# Patient Record
Sex: Female | Born: 1955 | Race: Black or African American | Hispanic: No | Marital: Married | Smoking: Former smoker
Health system: Southern US, Community
[De-identification: ages and names within clinical notes are randomized; demographics above are authoritative.]

## PROBLEM LIST (undated history)

## (undated) DIAGNOSIS — M1990 Unspecified osteoarthritis, unspecified site: Secondary | ICD-10-CM

## (undated) DIAGNOSIS — K5790 Diverticulosis of intestine, part unspecified, without perforation or abscess without bleeding: Secondary | ICD-10-CM

## (undated) DIAGNOSIS — I10 Essential (primary) hypertension: Secondary | ICD-10-CM

## (undated) DIAGNOSIS — K5792 Diverticulitis of intestine, part unspecified, without perforation or abscess without bleeding: Secondary | ICD-10-CM

## (undated) DIAGNOSIS — F419 Anxiety disorder, unspecified: Secondary | ICD-10-CM

## (undated) DIAGNOSIS — F0390 Unspecified dementia without behavioral disturbance: Secondary | ICD-10-CM

## (undated) DIAGNOSIS — E7800 Pure hypercholesterolemia, unspecified: Secondary | ICD-10-CM

## (undated) HISTORY — PX: WRIST ARTHROSCOPY: SUR100

## (undated) HISTORY — PX: FOOT ARTHROPLASTY: SHX1657

## (undated) HISTORY — DX: Diverticulitis of intestine, part unspecified, without perforation or abscess without bleeding: K57.92

## (undated) HISTORY — PX: EYE SURGERY: SHX253

## (undated) HISTORY — PX: BUNIONECTOMY: SHX129

## (undated) HISTORY — PX: ABDOMINAL HYSTERECTOMY: SHX81

## (undated) HISTORY — DX: Diverticulosis of intestine, part unspecified, without perforation or abscess without bleeding: K57.90

## (undated) HISTORY — DX: Anxiety disorder, unspecified: F41.9

---

## 2011-04-20 ENCOUNTER — Emergency Department (HOSPITAL_COMMUNITY)
Admission: EM | Admit: 2011-04-20 | Discharge: 2011-04-20 | Disposition: A | Discharge status: Home / Self Care | Payer: BC Managed Care – PPO | Attending: Emergency Medicine | Admitting: Emergency Medicine

## 2011-04-20 ENCOUNTER — Encounter: Payer: Self-pay | Admitting: Emergency Medicine

## 2011-04-20 DIAGNOSIS — S61209A Unspecified open wound of unspecified finger without damage to nail, initial encounter: Secondary | ICD-10-CM | POA: Insufficient documentation

## 2011-04-20 DIAGNOSIS — S61219A Laceration without foreign body of unspecified finger without damage to nail, initial encounter: Secondary | ICD-10-CM

## 2011-04-20 DIAGNOSIS — W260XXA Contact with knife, initial encounter: Secondary | ICD-10-CM | POA: Insufficient documentation

## 2011-04-20 DIAGNOSIS — E119 Type 2 diabetes mellitus without complications: Secondary | ICD-10-CM | POA: Insufficient documentation

## 2011-04-20 DIAGNOSIS — I10 Essential (primary) hypertension: Secondary | ICD-10-CM | POA: Insufficient documentation

## 2011-04-20 HISTORY — DX: Essential (primary) hypertension: I10

## 2011-04-20 HISTORY — DX: Pure hypercholesterolemia, unspecified: E78.00

## 2011-04-20 MED ORDER — HYDROCODONE-ACETAMINOPHEN 5-325 MG PO TABS
2.0000 | ORAL_TABLET | Freq: Once | ORAL | Status: AC
  Administered 2011-04-20: 2 via ORAL
  Filled 2011-04-20: qty 2

## 2011-04-20 MED ORDER — HYDROCODONE-ACETAMINOPHEN 5-325 MG PO TABS: 1.0000 | ORAL_TABLET | ORAL | Status: AC | PRN

## 2011-04-20 MED ORDER — ONDANSETRON HCL 4 MG PO TABS
4.0000 mg | ORAL_TABLET | Freq: Once | ORAL | Status: AC
  Administered 2011-04-20: 4 mg via ORAL
  Filled 2011-04-20: qty 1

## 2011-04-20 MED ORDER — LIDOCAINE HCL (PF) 1 % IJ SOLN
5.0000 mL | Freq: Once | INTRAMUSCULAR | Status: AC
  Administered 2011-04-20: 5 mL via INTRADERMAL
  Filled 2011-04-20: qty 5

## 2011-04-20 NOTE — ED Provider Notes (Addendum)
History     Chief Complaint  Patient presents with  . Extremity Laceration   Patient is a 55 y.o. female presenting with skin laceration. The history is provided by the patient.  Laceration  The incident occurred 3 to 5 hours ago. The laceration is located on the right hand. The laceration is 2 cm in size. The laceration mechanism was a a clean knife. The pain is moderate. The pain has been constant since onset. She reports no foreign bodies present. Her tetanus status is UTD.    Past Medical History  Diagnosis Date  . Hypertension   . Diabetes mellitus   . High cholesterol     Past Surgical History  Procedure Date  . Abdominal hysterectomy   . Foot arthroplasty     Family History  Problem Relation Age of Onset  . Heart failure Mother   . Diabetes Father     History  Substance Use Topics  . Smoking status: Never Smoker   . Smokeless tobacco: Not on file  . Alcohol Use: Yes     2 drinks/week    OB History    Grav Para Term Preterm Abortions TAB SAB Ect Mult Living                  Review of Systems  Constitutional: Negative for activity change.       All ROS Neg except as noted in HPI  HENT: Negative for nosebleeds and neck pain.   Eyes: Negative for photophobia and discharge.  Respiratory: Negative for cough, shortness of breath and wheezing.   Cardiovascular: Negative for chest pain and palpitations.  Gastrointestinal: Negative for abdominal pain and blood in stool.  Genitourinary: Negative for dysuria, frequency and hematuria.  Musculoskeletal: Negative for back pain and arthralgias.  Skin: Negative.   Neurological: Negative for dizziness, seizures and speech difficulty.  Psychiatric/Behavioral: Negative for hallucinations and confusion.    Physical Exam  BP 116/64  Pulse 100  Temp(Src) 98.7 F (37.1 C) (Oral)  Resp 18  Wt 190 lb (86.183 kg)  SpO2 97%  Physical Exam  Nursing note and vitals reviewed. Constitutional: She is oriented to person,  place, and time. She appears well-developed and well-nourished.  Non-toxic appearance.  HENT:  Head: Normocephalic.  Right Ear: Tympanic membrane and external ear normal.  Left Ear: Tympanic membrane and external ear normal.  Eyes: EOM and lids are normal. Pupils are equal, round, and reactive to light.  Neck: Normal range of motion. Neck supple. Carotid bruit is not present.  Cardiovascular: Normal rate, regular rhythm, normal heart sounds, intact distal pulses and normal pulses.   Pulmonary/Chest: Breath sounds normal. No respiratory distress.  Abdominal: Soft. Bowel sounds are normal. There is no tenderness. There is no guarding.  Musculoskeletal:       Laceration to the right index finger. No tendon or bone involvement. Sensory and cap refill wnl.  Lymphadenopathy:       Head (right side): No submandibular adenopathy present.       Head (left side): No submandibular adenopathy present.    She has no cervical adenopathy.  Neurological: She is alert and oriented to person, place, and time. She has normal strength. No cranial nerve deficit or sensory deficit.  Skin: Skin is warm and dry.  Psychiatric: She has a normal mood and affect. Her speech is normal.    ED Course  LACERATION REPAIR Performed by: Kathie Dike Authorized by: Kathie Dike Consent: Verbal consent obtained. Risks and  benefits: risks, benefits and alternatives were discussed Consent given by: patient Patient understanding: patient states understanding of the procedure being performed Patient identity confirmed: verbally with patient Time out: Immediately prior to procedure a "time out" was called to verify the correct patient, procedure, equipment, support staff and site/side marked as required. Body area: upper extremity Location details: right index finger Laceration length: 2.4 cm Foreign bodies: no foreign bodies Tendon involvement: none Nerve involvement: none Vascular damage: no Anesthesia: local  infiltration Local anesthetic: lidocaine 1% without epinephrine Patient sedated: no Preparation: Patient was prepped and draped in the usual sterile fashion. Irrigation solution: saline Amount of cleaning: standard Debridement: none Skin closure: 4-0 nylon Number of sutures: 5 Technique: simple Approximation: close Approximation difficulty: simple Dressing: gauze roll Patient tolerance: Patient tolerated the procedure well with no immediate complications.    MDM I have reviewed nursing notes, vital signs, and all appropriate lab and imaging results for this patient.      Kathie Dike, PA 04/22/11 1926  Kathie Dike, PA 10/27/11 1130  Kathie Dike, [place] 11/09/11 2207  Kathie Dike, [place] 11/21/11 [phone]

## 2011-04-20 NOTE — ED Notes (Signed)
Pt states she cut R index finger on knife wihile reaching into drawer. Bleeding controlled. PT states tetanu up to date

## 2011-07-02 NOTE — ED Provider Notes (Signed)
Medical screening examination/treatment/procedure(s) were performed by non-physician practitioner and as supervising physician I was immediately available for consultation/collaboration.   Shelda Jakes, MD 07/02/11 [phone]

## 2014-02-03 ENCOUNTER — Encounter (HOSPITAL_COMMUNITY): Payer: Self-pay | Admitting: Pharmacy Technician

## 2014-02-04 NOTE — Patient Instructions (Addendum)
20     Your procedure is scheduled on:  Tuesday 02/18/2014  Report to [facility] at  0730 AM.  Call this number if you have problems the night before or morning of surgery:  [phone]   Remember:             IF YOU USE CPAP,BRING MASK AND TUBING AM OF SURGERY!             IF YOU DO NOT HAVE YOUR TYPE AND SCREEN DRAWN AT PRE-ADMIT APPOINTMENT, YOU WILL HAVE IT DRAWN AM OF SURGERY!   Do not eat food or drink liquids AFTER MIDNIGHT!  Take these medicines the morning of surgery with A SIP OF WATER: Prednisone, Singulair, Atrovent nasal spray, Ultram if needed     IS NOT RESPONSIBLE FOR ANY BELONGINGS OR VALUABLES BROUGHT TO HOSPITAL.  Marland Kitchen  Leave suitcase in the car. After surgery it may be brought to your room.  For patients admitted to the hospital, checkout time is 11:00 AM the day of              Discharge.    DO NOT WEAR  JEWELRY,MAKE-UP,LOTIONS,POWDERS,PERFUMES,CONTACTS , DENTURES OR BRIDGEWORK ,AND DO NOT WEAR FALSE EYELASHES                                    Patients discharged the day of surgery will not be allowed to drive home. If going home the same day of surgery, must have someone stay with you first 24 hrs.at home and arrange for someone to drive you home from the Hospital.                        YOUR DRIVER IS:N/A   Special Instructions:              Please read over the following fact sheets that you were given:             1.  PREPARING FOR SURGERY SHEET              2.[facility]                                        Brandi Fitzgerald     [phone]                              [facility] - Preparing for Surgery Before surgery, you can play an important role.  Because skin is not sterile, your skin needs to be as free of germs as possible.  You can reduce the number of germs on your skin by washing with CHG (chlorahexidine gluconate) soap before surgery.  CHG is an antiseptic cleaner which kills germs and  bonds with the skin to continue killing germs even after washing. Please DO NOT use if you have an allergy to CHG or antibacterial soaps.  If your skin becomes reddened/irritated stop using the CHG and inform your nurse when you arrive at Short Stay. Do not shave (including legs and underarms) for at least 48 hours prior to the first CHG shower.  You may shave your face. Please follow these instructions carefully:  1.  Shower with CHG Soap the night before surgery and  the  morning of Surgery.  2.  If you choose to wash your hair, wash your hair first as usual with your  normal  shampoo.  3.  After you shampoo, rinse your hair and body thoroughly to remove the  shampoo.                           4.  Use CHG as you would any other liquid soap.  You can apply chg directly  to the skin and wash                       Gently with a scrungie or clean washcloth.  5.  Apply the CHG Soap to your body ONLY FROM THE NECK DOWN.   Do not use on open                           Wound or open sores. Avoid contact with eyes, ears mouth and genitals (private parts).                        Genitals (private parts) with your normal soap.             6.  Wash thoroughly, paying special attention to the area where your surgery  will be performed.  7.  Thoroughly rinse your body with warm water from the neck down.  8.  DO NOT shower/wash with your normal soap after using and rinsing off  the CHG Soap.                9.  Pat yourself dry with a clean towel.            10.  Wear clean pajamas.            11.  Place clean sheets on your bed the night of your first shower and do not  sleep with pets. Day of Surgery : Do not apply any lotions/deodorants the morning of surgery.  Please wear clean clothes to the hospital/surgery center.  FAILURE TO FOLLOW THESE INSTRUCTIONS MAY RESULT IN THE CANCELLATION OF YOUR SURGERY PATIENT SIGNATURE_________________________________  NURSE  SIGNATURE__________________________________  ________________________________________________________________________   Brandi Fitzgerald  An incentive spirometer is a tool that can help keep your lungs clear and active. This tool measures how well you are filling your lungs with each breath. Taking long deep breaths may help reverse or decrease the chance of developing breathing (pulmonary) problems (especially infection) following:  A long period of time when you are unable to move or be active. BEFORE THE PROCEDURE   If the spirometer includes an indicator to show your best effort, your nurse or respiratory therapist will set it to a desired goal.  If possible, sit up straight or lean slightly forward. Try not to slouch.  Hold the incentive spirometer in an upright position. INSTRUCTIONS FOR USE  1. Sit on the edge of your bed if possible, or sit up as far as you can in bed or on a chair. 2. Hold the incentive spirometer in an upright position. 3. Breathe out normally. 4. Place the mouthpiece in your mouth and seal your lips tightly around it. 5. Breathe in slowly and as deeply as possible, raising the piston or the ball toward the top of the column. 6. Hold your breath for 3-5 seconds or for as long as possible. Allow  the piston or ball to fall to the bottom of the column. 7. Remove the mouthpiece from your mouth and breathe out normally. 8. Rest for a few seconds and repeat Steps 1 through 7 at least 10 times every 1-2 hours when you are awake. Take your time and take a few normal breaths between deep breaths. 9. The spirometer may include an indicator to show your best effort. Use the indicator as a goal to work toward during each repetition. 10. After each set of 10 deep breaths, practice coughing to be sure your lungs are clear. If you have an incision (the cut made at the time of surgery), support your incision when coughing by placing a pillow or rolled up towels firmly  against it. Once you are able to get out of bed, walk around indoors and cough well. You may stop using the incentive spirometer when instructed by your caregiver.  RISKS AND COMPLICATIONS  Take your time so you do not get dizzy or light-headed.  If you are in pain, you may need to take or ask for pain medication before doing incentive spirometry. It is harder to take a deep breath if you are having pain. AFTER USE  Rest and breathe slowly and easily.  It can be helpful to keep track of a log of your progress. Your caregiver can provide you with a simple table to help with this. If you are using the spirometer at home, follow these instructions: SEEK MEDICAL CARE IF:   You are having difficultly using the spirometer.  You have trouble using the spirometer as often as instructed.  Your pain medication is not giving enough relief while using the spirometer.  You develop fever of 100.5 F (38.1 C) or higher. SEEK IMMEDIATE MEDICAL CARE IF:   You cough up bloody sputum that had not been present before.  You develop fever of 102 F (38.9 C) or greater.  You develop worsening pain at or near the incision site. MAKE SURE YOU:   Understand these instructions.  Will watch your condition.  Will get help right away if you are not doing well or get worse. Document Released: 02/06/2007 Document Revised: 12/19/2011 Document Reviewed: 04/09/2007 [employer] Patient Information 2014 Exit[employer], [place].   ________________________________________________________________________  WHAT IS A BLOOD TRANSFUSION? Blood Transfusion Information  A transfusion is the replacement of blood or some of its parts. Blood is made up of multiple cells which provide different functions.  Red blood cells carry oxygen and are used for blood loss replacement.  White blood cells fight against infection.  Platelets control bleeding.  Plasma helps clot blood.  Other blood products are available for  specialized needs, such as hemophilia or other clotting disorders. BEFORE THE TRANSFUSION  Who gives blood for transfusions?   Healthy volunteers who are fully evaluated to make sure their blood is safe. This is blood bank blood. Transfusion therapy is the safest it has ever been in the practice of medicine. Before blood is taken from a donor, a complete history is taken to make sure that person has no history of diseases nor engages in risky social behavior (examples are intravenous drug use or sexual activity with multiple partners). The donor's travel history is screened to minimize risk of transmitting infections, such as malaria. The donated blood is tested for signs of infectious diseases, such as HIV and hepatitis. The blood is then tested to be sure it is compatible with you in order to minimize the chance of a transfusion reaction.  If you or a relative donates blood, this is often done in anticipation of surgery and is not appropriate for emergency situations. It takes many days to process the donated blood. RISKS AND COMPLICATIONS Although transfusion therapy is very safe and saves many lives, the main dangers of transfusion include:   Getting an infectious disease.  Developing a transfusion reaction. This is an allergic reaction to something in the blood you were given. Every precaution is taken to prevent this. The decision to have a blood transfusion has been considered carefully by your caregiver before blood is given. Blood is not given unless the benefits outweigh the risks. AFTER THE TRANSFUSION  Right after receiving a blood transfusion, you will usually feel much better and more energetic. This is especially true if your red blood cells have gotten low (anemic). The transfusion raises the level of the red blood cells which carry oxygen, and this usually causes an energy increase.  The nurse administering the transfusion will monitor you carefully for complications. HOME CARE  INSTRUCTIONS  No special instructions are needed after a transfusion. You may find your energy is better. Speak with your caregiver about any limitations on activity for underlying diseases you may have. SEEK MEDICAL CARE IF:   Your condition is not improving after your transfusion.  You develop redness or irritation at the intravenous (IV) site. SEEK IMMEDIATE MEDICAL CARE IF:  Any of the following symptoms occur over the next 12 hours:  Shaking chills.  You have a temperature by mouth above 102 F (38.9 C), not controlled by medicine.  Chest, back, or muscle pain.  People around you feel you are not acting correctly or are confused.  Shortness of breath or difficulty breathing.  Dizziness and fainting.  You get a rash or develop hives.  You have a decrease in urine output.  Your urine turns a dark color or changes to pink, red, or brown. Any of the following symptoms occur over the next 10 days:  You have a temperature by mouth above 102 F (38.9 C), not controlled by medicine.  Shortness of breath.  Weakness after normal activity.  The white part of the eye turns yellow (jaundice).  You have a decrease in the amount of urine or are urinating less often.  Your urine turns a dark color or changes to pink, red, or brown. Document Released: 09/23/2000 Document Revised: 12/19/2011 Document Reviewed: 05/12/2008 [facility] Patient Information 2014 [place], [place].  _______________________________________________________________________

## 2014-02-05 ENCOUNTER — Encounter (HOSPITAL_COMMUNITY)
Admission: RE | Admit: 2014-02-05 | Discharge: 2014-02-05 | Disposition: A | Discharge status: Home / Self Care | Payer: BC Managed Care – PPO | Source: Ambulatory Visit | Attending: Orthopedic Surgery | Admitting: Orthopedic Surgery

## 2014-02-05 ENCOUNTER — Encounter (HOSPITAL_COMMUNITY): Payer: Self-pay

## 2014-02-05 ENCOUNTER — Other Ambulatory Visit: Payer: Self-pay | Admitting: Physician Assistant

## 2014-02-05 ENCOUNTER — Ambulatory Visit (HOSPITAL_COMMUNITY)
Admission: RE | Admit: 2014-02-05 | Discharge: 2014-02-05 | Disposition: A | Discharge status: Home / Self Care | Payer: BC Managed Care – PPO | Source: Ambulatory Visit | Attending: Anesthesiology | Admitting: Anesthesiology

## 2014-02-05 DIAGNOSIS — Z01818 Encounter for other preprocedural examination: Secondary | ICD-10-CM | POA: Insufficient documentation

## 2014-02-05 HISTORY — DX: Unspecified osteoarthritis, unspecified site: M19.90

## 2014-02-05 LAB — URINALYSIS, ROUTINE W REFLEX MICROSCOPIC
BILIRUBIN URINE: NEGATIVE
Glucose, UA: NEGATIVE mg/dL
Hgb urine dipstick: NEGATIVE
Ketones, ur: NEGATIVE mg/dL
LEUKOCYTES UA: NEGATIVE
NITRITE: NEGATIVE
PROTEIN: NEGATIVE mg/dL
Specific Gravity, Urine: 1.02 (ref 1.005–1.030)
UROBILINOGEN UA: 0.2 mg/dL (ref 0.0–1.0)
pH: 5.5 (ref 5.0–8.0)

## 2014-02-05 LAB — CBC
HCT: 35.9 % — ABNORMAL LOW (ref 36.0–46.0)
Hemoglobin: 11.6 g/dL — ABNORMAL LOW (ref 12.0–15.0)
MCH: 29.2 pg (ref 26.0–34.0)
MCHC: 32.3 g/dL (ref 30.0–36.0)
MCV: 90.4 fL (ref 78.0–100.0)
Platelets: 477 10*3/uL — ABNORMAL HIGH (ref 150–400)
RBC: 3.97 MIL/uL (ref 3.87–5.11)
RDW: 14.8 % (ref 11.5–15.5)
WBC: 14.2 10*3/uL — AB (ref 4.0–10.5)

## 2014-02-05 LAB — APTT: aPTT: 32 seconds (ref 24–37)

## 2014-02-05 LAB — BASIC METABOLIC PANEL
BUN: 14 mg/dL (ref 6–23)
CO2: 27 mEq/L (ref 19–32)
CREATININE: 0.96 mg/dL (ref 0.50–1.10)
Calcium: 10 mg/dL (ref 8.4–10.5)
Chloride: 99 mEq/L (ref 96–112)
GFR calc non Af Amer: 63 mL/min — ABNORMAL LOW (ref >90)
GFR, EST AFRICAN AMERICAN: 74 mL/min — AB (ref >90)
Glucose, Bld: 117 mg/dL — ABNORMAL HIGH (ref 70–99)
POTASSIUM: 4.1 meq/L (ref 3.7–5.3)
Sodium: 139 mEq/L (ref 137–147)

## 2014-02-05 LAB — PROTIME-INR
INR: 1.07 (ref 0.00–1.49)
PROTHROMBIN TIME: 13.7 s (ref 11.6–15.2)

## 2014-02-05 LAB — SURGICAL PCR SCREEN
MRSA, PCR: NEGATIVE
STAPHYLOCOCCUS AUREUS: NEGATIVE

## 2014-02-05 NOTE — Progress Notes (Signed)
01/03/2014-Last office note and EKG from Dr. Kathryne Sharper from [facility], [place]. On chart.

## 2014-02-05 NOTE — H&P (Signed)
Brandi Fitzgerald is an 58 y.o. female.    Chief Complaint: left knee pain  HPI: Pt is a 58 y.o. female complaining of left knee pain for multiple years. Pain had continually increased since the beginning. X-rays in the clinic show end-stage arthritic changes of the left knee. Pt has tried various conservative treatments which have failed to alleviate their symptoms, including injections and therapy. Various options are discussed with the patient. Risks, benefits and expectations were discussed with the patient. Patient understand the risks, benefits and expectations and wishes to proceed with surgery.   PCP:  NIDA,GEBRESELASSIE, MD  D/C Plans:  SNF/Rehab  PMH: Past Medical History  Diagnosis Date  . Hypertension   . Diabetes mellitus   . High cholesterol     PSH: Past Surgical History  Procedure Laterality Date  . Abdominal hysterectomy    . Foot arthroplasty      Social History:  reports that she has never smoked. She does not have any smokeless tobacco history on file. She reports that she drinks alcohol. She reports that she does not use illicit drugs.  Allergies:  Allergies  Allergen Reactions  . Januvia [Sitagliptin Phosphate] Anaphylaxis  . Aspirin     Cant remember  . Cephalosporins Swelling  . Penicillins Itching and Swelling    Medications: Current Outpatient Prescriptions  Medication Sig Dispense Refill  . acidophilus (RISAQUAD) CAPS capsule Take 1 capsule by mouth daily.      Marland Kitchen esomeprazole (NEXIUM) 40 MG capsule Take 40 mg by mouth daily at 12 noon.      . Estradiol (VAGIFEM) 10 MCG TABS vaginal tablet Place 1 tablet vaginally 2 (two) times a week.      . etanercept (ENBREL) 25 MG injection Inject 50 mg into the skin once a week.      Marland Kitchen HYDROcodone-acetaminophen (NORCO/VICODIN) 5-325 MG per tablet Take 1 tablet by mouth every 6 (six) hours as needed for moderate pain.      Marland Kitchen ipratropium (ATROVENT) 0.03 % nasal spray Place 2 sprays into both nostrils daily.       Marland Kitchen lisinopril (PRINIVIL,ZESTRIL) 20 MG tablet Take 20 mg by mouth every morning.      . metFORMIN (GLUCOPHAGE) 500 MG tablet Take 500 mg by mouth daily with breakfast.      . montelukast (SINGULAIR) 10 MG tablet Take 10 mg by mouth daily.      . predniSONE (DELTASONE) 5 MG tablet Take 5 mg by mouth daily with breakfast.      . rosuvastatin (CRESTOR) 20 MG tablet Take 20 mg by mouth every morning.      . traMADol (ULTRAM) 50 MG tablet Take 50 mg by mouth every 6 (six) hours as needed for moderate pain.      . valACYclovir (VALTREX) 500 MG tablet Take 500 mg by mouth daily.       No current facility-administered medications for this visit.    No results found for this or any previous visit (from the past 48 hour(s)). No results found.  ROS: Pain with rom of the left lower extremity  Physical Exam: BP:   190/110  ;  HR:   84  ; Resp:   14  : Alert and oriented 58 y.o. female in no acute distress Cranial nerves 2-12 intact Cervical spine: full rom with no tenderness, nv intact distally Chest: active breath sounds bilaterally, no wheeze rhonchi or rales Heart: regular rate and rhythm, no murmur Abd: non tender non distended with active bowel  sounds Hip is stable with rom  Pain with rom of bilateral knees nv intact distally No rashes or edema Moderately antalgic gait  Assessment/Plan Assessment: left knee end stage osteoarthritis  Plan: Patient will undergo a left total knee arthroplasty by Dr. Charlann Boxer at [facility]. Risks benefits and expectations were discussed with the patient. Patient understand risks, benefits and expectations and wishes to proceed.

## 2014-02-18 ENCOUNTER — Inpatient Hospital Stay (HOSPITAL_COMMUNITY): Payer: BC Managed Care – PPO | Admitting: Anesthesiology

## 2014-02-18 ENCOUNTER — Encounter (HOSPITAL_COMMUNITY): Payer: Self-pay | Admitting: *Deleted

## 2014-02-18 ENCOUNTER — Inpatient Hospital Stay (HOSPITAL_COMMUNITY)
Admission: RE | Admit: 2014-02-18 | Discharge: 2014-02-20 | DRG: 470 | Disposition: A | Discharge status: Home Health | Payer: BC Managed Care – PPO | Source: Ambulatory Visit | Attending: Orthopedic Surgery | Admitting: Orthopedic Surgery

## 2014-02-18 ENCOUNTER — Encounter (HOSPITAL_COMMUNITY)
Admission: RE | Disposition: A | Discharge status: Home Health | Payer: Self-pay | Source: Ambulatory Visit | Attending: Orthopedic Surgery

## 2014-02-18 ENCOUNTER — Encounter (HOSPITAL_COMMUNITY): Payer: BC Managed Care – PPO | Admitting: Anesthesiology

## 2014-02-18 DIAGNOSIS — D500 Iron deficiency anemia secondary to blood loss (chronic): Secondary | ICD-10-CM | POA: Diagnosis not present

## 2014-02-18 DIAGNOSIS — IMO0002 Reserved for concepts with insufficient information to code with codable children: Secondary | ICD-10-CM

## 2014-02-18 DIAGNOSIS — I10 Essential (primary) hypertension: Secondary | ICD-10-CM | POA: Diagnosis present

## 2014-02-18 DIAGNOSIS — M069 Rheumatoid arthritis, unspecified: Secondary | ICD-10-CM | POA: Diagnosis present

## 2014-02-18 DIAGNOSIS — E119 Type 2 diabetes mellitus without complications: Secondary | ICD-10-CM | POA: Diagnosis present

## 2014-02-18 DIAGNOSIS — M1710 Unilateral primary osteoarthritis, unspecified knee: Principal | ICD-10-CM | POA: Diagnosis present

## 2014-02-18 DIAGNOSIS — M6580 Other synovitis and tenosynovitis, unspecified site: Secondary | ICD-10-CM | POA: Diagnosis present

## 2014-02-18 DIAGNOSIS — Z888 Allergy status to other drugs, medicaments and biological substances status: Secondary | ICD-10-CM

## 2014-02-18 DIAGNOSIS — Z79899 Other long term (current) drug therapy: Secondary | ICD-10-CM

## 2014-02-18 DIAGNOSIS — E669 Obesity, unspecified: Secondary | ICD-10-CM | POA: Diagnosis present

## 2014-02-18 DIAGNOSIS — Z880 Allergy status to penicillin: Secondary | ICD-10-CM

## 2014-02-18 DIAGNOSIS — Z6833 Body mass index (BMI) 33.0-33.9, adult: Secondary | ICD-10-CM

## 2014-02-18 DIAGNOSIS — D62 Acute posthemorrhagic anemia: Secondary | ICD-10-CM | POA: Diagnosis not present

## 2014-02-18 DIAGNOSIS — Z96659 Presence of unspecified artificial knee joint: Secondary | ICD-10-CM

## 2014-02-18 DIAGNOSIS — E7800 Pure hypercholesterolemia, unspecified: Secondary | ICD-10-CM | POA: Diagnosis present

## 2014-02-18 HISTORY — PX: TOTAL KNEE ARTHROPLASTY: SHX125

## 2014-02-18 LAB — GLUCOSE, CAPILLARY
Glucose-Capillary: 93 mg/dL (ref 70–99)
Glucose-Capillary: 115 mg/dL — ABNORMAL HIGH (ref 70–99)
Glucose-Capillary: 133 mg/dL — ABNORMAL HIGH (ref 70–99)
Glucose-Capillary: 134 mg/dL — ABNORMAL HIGH (ref 70–99)

## 2014-02-18 LAB — TYPE AND SCREEN
ABO/RH(D): O POS
Antibody Screen: NEGATIVE

## 2014-02-18 LAB — ABO/RH: ABO/RH(D): O POS

## 2014-02-18 SURGERY — ARTHROPLASTY, KNEE, TOTAL
Anesthesia: Spinal | Site: Knee | Laterality: Left

## 2014-02-18 MED ORDER — RISAQUAD PO CAPS
1.0000 | ORAL_CAPSULE | Freq: Every day | ORAL | Status: DC
  Administered 2014-02-18 – 2014-02-20 (×3): 1 via ORAL
  Filled 2014-02-18 (×3): qty 1

## 2014-02-18 MED ORDER — BUPIVACAINE-EPINEPHRINE (PF) 0.25% -1:200000 IJ SOLN
INTRAMUSCULAR | Status: AC
  Filled 2014-02-18: qty 30

## 2014-02-18 MED ORDER — VALACYCLOVIR HCL 500 MG PO TABS
500.0000 mg | ORAL_TABLET | Freq: Every day | ORAL | Status: DC
  Administered 2014-02-18 – 2014-02-20 (×3): 500 mg via ORAL
  Filled 2014-02-18 (×3): qty 1

## 2014-02-18 MED ORDER — ONDANSETRON HCL 4 MG/2ML IJ SOLN
INTRAMUSCULAR | Status: AC
  Filled 2014-02-18: qty 2

## 2014-02-18 MED ORDER — PHENOL 1.4 % MT LIQD: 1.0000 | OROMUCOSAL | Status: DC | PRN

## 2014-02-18 MED ORDER — PHENYLEPHRINE HCL 10 MG/ML IJ SOLN
INTRAMUSCULAR | Status: DC | PRN
  Administered 2014-02-18 (×9): 40 ug via INTRAVENOUS

## 2014-02-18 MED ORDER — ONDANSETRON HCL 4 MG/2ML IJ SOLN
INTRAMUSCULAR | Status: DC | PRN
  Administered 2014-02-18: 4 mg via INTRAVENOUS

## 2014-02-18 MED ORDER — HYDROMORPHONE HCL PF 1 MG/ML IJ SOLN
0.5000 mg | INTRAMUSCULAR | Status: DC | PRN
  Administered 2014-02-18 (×2): 1 mg via INTRAVENOUS
  Administered 2014-02-18: 0.5 mg via INTRAVENOUS
  Administered 2014-02-18 – 2014-02-19 (×3): 1 mg via INTRAVENOUS
  Filled 2014-02-18 (×6): qty 1

## 2014-02-18 MED ORDER — LACTATED RINGERS IV SOLN
INTRAVENOUS | Status: DC
  Administered 2014-02-18: 1000 mL via INTRAVENOUS

## 2014-02-18 MED ORDER — MENTHOL 3 MG MT LOZG: 1.0000 | LOZENGE | OROMUCOSAL | Status: DC | PRN

## 2014-02-18 MED ORDER — FERROUS SULFATE 325 (65 FE) MG PO TABS
325.0000 mg | ORAL_TABLET | Freq: Three times a day (TID) | ORAL | Status: DC
  Administered 2014-02-18 – 2014-02-20 (×5): 325 mg via ORAL
  Filled 2014-02-18 (×9): qty 1

## 2014-02-18 MED ORDER — BUPIVACAINE-EPINEPHRINE (PF) 0.25% -1:200000 IJ SOLN
INTRAMUSCULAR | Status: DC | PRN
  Administered 2014-02-18: 30 mL

## 2014-02-18 MED ORDER — METHOCARBAMOL 1000 MG/10ML IJ SOLN
500.0000 mg | Freq: Four times a day (QID) | INTRAVENOUS | Status: DC | PRN
  Administered 2014-02-18: 500 mg via INTRAVENOUS
  Filled 2014-02-18: qty 5

## 2014-02-18 MED ORDER — METOCLOPRAMIDE HCL 5 MG/ML IJ SOLN: 5.0000 mg | Freq: Three times a day (TID) | INTRAMUSCULAR | Status: DC | PRN

## 2014-02-18 MED ORDER — HYDROMORPHONE HCL PF 1 MG/ML IJ SOLN: 0.2500 mg | INTRAMUSCULAR | Status: DC | PRN

## 2014-02-18 MED ORDER — KETOROLAC TROMETHAMINE 30 MG/ML IJ SOLN
INTRAMUSCULAR | Status: AC
  Filled 2014-02-18: qty 1

## 2014-02-18 MED ORDER — MIDAZOLAM HCL 2 MG/2ML IJ SOLN
INTRAMUSCULAR | Status: AC
  Filled 2014-02-18: qty 2

## 2014-02-18 MED ORDER — PROPOFOL 10 MG/ML IV BOLUS
INTRAVENOUS | Status: AC
  Filled 2014-02-18: qty 20

## 2014-02-18 MED ORDER — DIPHENHYDRAMINE HCL 25 MG PO CAPS
25.0000 mg | ORAL_CAPSULE | Freq: Four times a day (QID) | ORAL | Status: DC | PRN
Start: 2014-02-18 — End: 2014-02-20

## 2014-02-18 MED ORDER — BUPIVACAINE LIPOSOME 1.3 % IJ SUSP
20.0000 mL | Freq: Once | INTRAMUSCULAR | Status: AC
  Administered 2014-02-18: 20 mL
  Filled 2014-02-18: qty 20

## 2014-02-18 MED ORDER — IPRATROPIUM BROMIDE 0.03 % NA SOLN
2.0000 | Freq: Every day | NASAL | Status: DC
  Administered 2014-02-19 – 2014-02-20 (×2): 2 via NASAL
  Filled 2014-02-18: qty 30

## 2014-02-18 MED ORDER — BISACODYL 10 MG RE SUPP: 10.0000 mg | Freq: Every day | RECTAL | Status: DC | PRN

## 2014-02-18 MED ORDER — PANTOPRAZOLE SODIUM 40 MG PO TBEC
80.0000 mg | DELAYED_RELEASE_TABLET | Freq: Every day | ORAL | Status: DC
  Administered 2014-02-18 – 2014-02-20 (×3): 80 mg via ORAL
  Filled 2014-02-18 (×3): qty 2

## 2014-02-18 MED ORDER — PROPOFOL INFUSION 10 MG/ML OPTIME
INTRAVENOUS | Status: DC | PRN
  Administered 2014-02-18: 100 ug/kg/min via INTRAVENOUS

## 2014-02-18 MED ORDER — ALPRAZOLAM 0.5 MG PO TABS
0.5000 mg | ORAL_TABLET | Freq: Two times a day (BID) | ORAL | Status: DC | PRN
  Administered 2014-02-19: 0.5 mg via ORAL
  Filled 2014-02-18: qty 1

## 2014-02-18 MED ORDER — TRANEXAMIC ACID 100 MG/ML IV SOLN
1000.0000 mg | INTRAVENOUS | Status: AC
  Administered 2014-02-18: 1000 mg via INTRAVENOUS
  Filled 2014-02-18: qty 10

## 2014-02-18 MED ORDER — KETOROLAC TROMETHAMINE 30 MG/ML IJ SOLN
INTRAMUSCULAR | Status: DC | PRN
  Administered 2014-02-18: 30 mg

## 2014-02-18 MED ORDER — ACETAMINOPHEN 10 MG/ML IV SOLN
1000.0000 mg | Freq: Once | INTRAVENOUS | Status: AC
Start: 2014-02-18 — End: 2014-02-18
  Administered 2014-02-18: 1000 mg via INTRAVENOUS
  Filled 2014-02-18: qty 100

## 2014-02-18 MED ORDER — POLYETHYLENE GLYCOL 3350 17 G PO PACK
17.0000 g | PACK | Freq: Two times a day (BID) | ORAL | Status: DC
  Administered 2014-02-19 – 2014-02-20 (×3): 17 g via ORAL

## 2014-02-18 MED ORDER — SODIUM CHLORIDE 0.9 % IJ SOLN
INTRAMUSCULAR | Status: DC | PRN
  Administered 2014-02-18: 9 mL

## 2014-02-18 MED ORDER — PNEUMOCOCCAL VAC POLYVALENT 25 MCG/0.5ML IJ INJ
0.5000 mL | INJECTION | INTRAMUSCULAR | Status: DC
  Filled 2014-02-18 (×2): qty 0.5

## 2014-02-18 MED ORDER — DOCUSATE SODIUM 100 MG PO CAPS
100.0000 mg | ORAL_CAPSULE | Freq: Two times a day (BID) | ORAL | Status: DC
  Administered 2014-02-18 – 2014-02-20 (×4): 100 mg via ORAL

## 2014-02-18 MED ORDER — BUPIVACAINE IN DEXTROSE 0.75-8.25 % IT SOLN
INTRATHECAL | Status: DC | PRN
  Administered 2014-02-18: 1.8 mL via INTRATHECAL

## 2014-02-18 MED ORDER — ONDANSETRON HCL 4 MG PO TABS: 4.0000 mg | ORAL_TABLET | Freq: Four times a day (QID) | ORAL | Status: DC | PRN

## 2014-02-18 MED ORDER — INSULIN ASPART 100 UNIT/ML ~~LOC~~ SOLN: 0.0000 [IU] | Freq: Three times a day (TID) | SUBCUTANEOUS | Status: DC

## 2014-02-18 MED ORDER — PROPOFOL 10 MG/ML IV EMUL
INTRAVENOUS | Status: DC | PRN
  Administered 2014-02-18 (×2): 30 mg via INTRAVENOUS

## 2014-02-18 MED ORDER — CLINDAMYCIN PHOSPHATE 900 MG/50ML IV SOLN
INTRAVENOUS | Status: AC
  Filled 2014-02-18: qty 50

## 2014-02-18 MED ORDER — SCOPOLAMINE 1 MG/3DAYS TD PT72
1.0000 | MEDICATED_PATCH | TRANSDERMAL | Status: DC
  Filled 2014-02-18: qty 1

## 2014-02-18 MED ORDER — PROMETHAZINE HCL 25 MG/ML IJ SOLN: 6.2500 mg | INTRAMUSCULAR | Status: DC | PRN

## 2014-02-18 MED ORDER — MAGNESIUM CITRATE PO SOLN: 1.0000 | Freq: Once | ORAL | Status: AC | PRN

## 2014-02-18 MED ORDER — SODIUM CHLORIDE 0.9 % IJ SOLN
INTRAMUSCULAR | Status: AC
  Filled 2014-02-18: qty 10

## 2014-02-18 MED ORDER — LACTATED RINGERS IV SOLN
INTRAVENOUS | Status: DC | PRN
  Administered 2014-02-18 (×3): via INTRAVENOUS

## 2014-02-18 MED ORDER — HYDROCODONE-ACETAMINOPHEN 7.5-325 MG PO TABS
1.0000 | ORAL_TABLET | ORAL | Status: DC
  Administered 2014-02-18: 1 via ORAL
  Administered 2014-02-18 – 2014-02-20 (×10): 2 via ORAL
  Filled 2014-02-18: qty 2
  Filled 2014-02-18: qty 1
  Filled 2014-02-18 (×10): qty 2

## 2014-02-18 MED ORDER — ONDANSETRON HCL 4 MG/2ML IJ SOLN: 4.0000 mg | Freq: Four times a day (QID) | INTRAMUSCULAR | Status: DC | PRN

## 2014-02-18 MED ORDER — MIDAZOLAM HCL 5 MG/5ML IJ SOLN
INTRAMUSCULAR | Status: DC | PRN
  Administered 2014-02-18: 2 mg via INTRAVENOUS

## 2014-02-18 MED ORDER — METOCLOPRAMIDE HCL 10 MG PO TABS: 5.0000 mg | ORAL_TABLET | Freq: Three times a day (TID) | ORAL | Status: DC | PRN

## 2014-02-18 MED ORDER — ATORVASTATIN CALCIUM 40 MG PO TABS
40.0000 mg | ORAL_TABLET | Freq: Every day | ORAL | Status: DC
  Administered 2014-02-18 – 2014-02-19 (×2): 40 mg via ORAL
  Filled 2014-02-18 (×3): qty 1

## 2014-02-18 MED ORDER — METFORMIN HCL 500 MG PO TABS
500.0000 mg | ORAL_TABLET | Freq: Every day | ORAL | Status: DC
  Administered 2014-02-19 – 2014-02-20 (×2): 500 mg via ORAL
  Filled 2014-02-18 (×3): qty 1

## 2014-02-18 MED ORDER — PREDNISONE 5 MG PO TABS
5.0000 mg | ORAL_TABLET | Freq: Every day | ORAL | Status: DC
  Administered 2014-02-19 – 2014-02-20 (×2): 5 mg via ORAL
  Filled 2014-02-18 (×3): qty 1

## 2014-02-18 MED ORDER — CHLORHEXIDINE GLUCONATE 4 % EX LIQD: 60.0000 mL | Freq: Once | CUTANEOUS | Status: DC

## 2014-02-18 MED ORDER — METHOCARBAMOL 500 MG PO TABS
500.0000 mg | ORAL_TABLET | Freq: Four times a day (QID) | ORAL | Status: DC | PRN
  Administered 2014-02-18 – 2014-02-19 (×4): 500 mg via ORAL
  Filled 2014-02-18 (×4): qty 1

## 2014-02-18 MED ORDER — RIVAROXABAN 10 MG PO TABS
10.0000 mg | ORAL_TABLET | ORAL | Status: DC
  Administered 2014-02-19 – 2014-02-20 (×2): 10 mg via ORAL
  Filled 2014-02-18 (×3): qty 1

## 2014-02-18 MED ORDER — SODIUM CHLORIDE 0.9 % IV SOLN
INTRAVENOUS | Status: DC
  Administered 2014-02-18 – 2014-02-19 (×2): via INTRAVENOUS
  Filled 2014-02-18 (×4): qty 1000

## 2014-02-18 MED ORDER — KETAMINE HCL 10 MG/ML IJ SOLN
INTRAMUSCULAR | Status: DC | PRN
  Administered 2014-02-18 (×9): 5 mg via INTRAVENOUS

## 2014-02-18 MED ORDER — DEXAMETHASONE SODIUM PHOSPHATE 10 MG/ML IJ SOLN
INTRAMUSCULAR | Status: DC | PRN
  Administered 2014-02-18: 10 mg via INTRAVENOUS

## 2014-02-18 MED ORDER — CLINDAMYCIN PHOSPHATE 600 MG/50ML IV SOLN
600.0000 mg | Freq: Four times a day (QID) | INTRAVENOUS | Status: AC
  Administered 2014-02-18 (×2): 600 mg via INTRAVENOUS
  Filled 2014-02-18 (×2): qty 50

## 2014-02-18 MED ORDER — SODIUM CHLORIDE 0.9 % IR SOLN
Status: DC | PRN
  Administered 2014-02-18: 1000 mL

## 2014-02-18 MED ORDER — ALUM & MAG HYDROXIDE-SIMETH 200-200-20 MG/5ML PO SUSP: 30.0000 mL | ORAL | Status: DC | PRN

## 2014-02-18 MED ORDER — LIDOCAINE HCL (CARDIAC) 20 MG/ML IV SOLN
INTRAVENOUS | Status: AC
  Filled 2014-02-18: qty 5

## 2014-02-18 MED ORDER — MONTELUKAST SODIUM 10 MG PO TABS
10.0000 mg | ORAL_TABLET | Freq: Every day | ORAL | Status: DC
  Administered 2014-02-19 – 2014-02-20 (×2): 10 mg via ORAL
  Filled 2014-02-18 (×2): qty 1

## 2014-02-18 MED ORDER — CLINDAMYCIN PHOSPHATE 900 MG/50ML IV SOLN
900.0000 mg | INTRAVENOUS | Status: AC
  Administered 2014-02-18: 900 mg via INTRAVENOUS

## 2014-02-18 SURGICAL SUPPLY — 57 items
BAG ZIPLOCK 12X15 (MISCELLANEOUS) ×3 IMPLANT
BANDAGE ELASTIC 6 VELCRO ST LF (GAUZE/BANDAGES/DRESSINGS) ×3 IMPLANT
BANDAGE ESMARK 6X9 LF (GAUZE/BANDAGES/DRESSINGS) ×1 IMPLANT
BLADE SAW SGTL 13.0X1.19X90.0M (BLADE) ×3 IMPLANT
BNDG ESMARK 6X9 LF (GAUZE/BANDAGES/DRESSINGS) ×3
BONE CEMENT GENTAMICIN (Cement) ×6 IMPLANT
BOWL SMART MIX CTS (DISPOSABLE) ×3 IMPLANT
CAP KNEE ATTUNE RP ×3 IMPLANT
CEMENT BONE GENTAMICIN 40 (Cement) ×2 IMPLANT
CUFF TOURN SGL QUICK 34 (TOURNIQUET CUFF) ×2
CUFF TRNQT CYL 34X4X40X1 (TOURNIQUET CUFF) ×1 IMPLANT
DECANTER SPIKE VIAL GLASS SM (MISCELLANEOUS) ×3 IMPLANT
DERMABOND ADVANCED (GAUZE/BANDAGES/DRESSINGS) ×2
DERMABOND ADVANCED .7 DNX12 (GAUZE/BANDAGES/DRESSINGS) ×1 IMPLANT
DRAPE EXTREMITY T 121X128X90 (DRAPE) ×3 IMPLANT
DRAPE POUCH INSTRU U-SHP 10X18 (DRAPES) ×3 IMPLANT
DRAPE U-SHAPE 47X51 STRL (DRAPES) ×3 IMPLANT
DRSG AQUACEL AG ADV 3.5X10 (GAUZE/BANDAGES/DRESSINGS) ×3 IMPLANT
DRSG TEGADERM 4X4.75 (GAUZE/BANDAGES/DRESSINGS) IMPLANT
DURAPREP 26ML APPLICATOR (WOUND CARE) ×3 IMPLANT
ELECT REM PT RETURN 9FT ADLT (ELECTROSURGICAL) ×3
ELECTRODE REM PT RTRN 9FT ADLT (ELECTROSURGICAL) ×1 IMPLANT
EVACUATOR 1/8 PVC DRAIN (DRAIN) IMPLANT
FACESHIELD WRAPAROUND (MASK) ×15 IMPLANT
GAUZE SPONGE 2X2 8PLY STRL LF (GAUZE/BANDAGES/DRESSINGS) IMPLANT
GLOVE BIOGEL PI IND STRL 7.5 (GLOVE) ×1 IMPLANT
GLOVE BIOGEL PI IND STRL 8 (GLOVE) ×1 IMPLANT
GLOVE BIOGEL PI INDICATOR 7.5 (GLOVE) ×2
GLOVE BIOGEL PI INDICATOR 8 (GLOVE) ×2
GLOVE ECLIPSE 8.0 STRL XLNG CF (GLOVE) IMPLANT
GLOVE ORTHO TXT STRL SZ7.5 (GLOVE) ×6 IMPLANT
GOWN SPEC L3 XXLG W/TWL (GOWN DISPOSABLE) ×3 IMPLANT
GOWN STRL REUS W/TWL LRG LVL3 (GOWN DISPOSABLE) ×3 IMPLANT
HANDPIECE INTERPULSE COAX TIP (DISPOSABLE) ×2
KIT BASIN OR (CUSTOM PROCEDURE TRAY) ×3 IMPLANT
MANIFOLD NEPTUNE II (INSTRUMENTS) ×3 IMPLANT
NDL SAFETY ECLIPSE 18X1.5 (NEEDLE) ×1 IMPLANT
NEEDLE HYPO 18GX1.5 SHARP (NEEDLE) ×2
NS IRRIG 1000ML POUR BTL (IV SOLUTION) ×3 IMPLANT
PACK TOTAL JOINT (CUSTOM PROCEDURE TRAY) ×3 IMPLANT
POSITIONER SURGICAL ARM (MISCELLANEOUS) ×3 IMPLANT
SET HNDPC FAN SPRY TIP SCT (DISPOSABLE) ×1 IMPLANT
SET PAD KNEE POSITIONER (MISCELLANEOUS) ×3 IMPLANT
SPONGE GAUZE 2X2 STER 10/PKG (GAUZE/BANDAGES/DRESSINGS)
SUCTION FRAZIER 12FR DISP (SUCTIONS) ×3 IMPLANT
SUT MNCRL AB 4-0 PS2 18 (SUTURE) ×3 IMPLANT
SUT VIC AB 1 CT1 36 (SUTURE) ×3 IMPLANT
SUT VIC AB 2-0 CT1 27 (SUTURE) ×6
SUT VIC AB 2-0 CT1 TAPERPNT 27 (SUTURE) ×3 IMPLANT
SUT VLOC 180 0 24IN GS25 (SUTURE) ×3 IMPLANT
SYR 50ML LL SCALE MARK (SYRINGE) ×3 IMPLANT
TOWEL OR 17X26 10 PK STRL BLUE (TOWEL DISPOSABLE) ×3 IMPLANT
TOWEL OR NON WOVEN STRL DISP B (DISPOSABLE) IMPLANT
TRAY FOLEY CATH 14FRSI W/METER (CATHETERS) IMPLANT
TRAY FOLEY METER SIL LF 16FR (CATHETERS) ×3 IMPLANT
WATER STERILE IRR 1500ML POUR (IV SOLUTION) ×3 IMPLANT
WRAP KNEE MAXI GEL POST OP (GAUZE/BANDAGES/DRESSINGS) ×3 IMPLANT

## 2014-02-18 NOTE — Anesthesia Preprocedure Evaluation (Addendum)
Anesthesia Evaluation  Patient identified by MRN, date of birth, ID band Patient awake    Reviewed: Allergy & Precautions, H&P , NPO status , Patient's Chart, lab work & pertinent test results  Airway Mallampati: II TM Distance: >3 FB Neck ROM: Full    Dental no notable dental hx.    Pulmonary neg pulmonary ROS, former smoker,  breath sounds clear to auscultation  Pulmonary exam normal       Cardiovascular hypertension, Pt. on medications Rhythm:Regular Rate:Normal     Neuro/Psych negative neurological ROS  negative psych ROS   GI/Hepatic negative GI ROS, Neg liver ROS,   Endo/Other  diabetes  Renal/GU negative Renal ROS  negative genitourinary   Musculoskeletal  (+) Arthritis -, Rheumatoid disorders and on steriods ,    Abdominal   Peds negative pediatric ROS (+)  Hematology  (+) anemia ,   Anesthesia Other Findings   Reproductive/Obstetrics negative OB ROS                          Anesthesia Physical Anesthesia Plan  ASA: III  Anesthesia Plan: Spinal   Post-op Pain Management:    Induction: Intravenous  Airway Management Planned: Simple Face Mask  Additional Equipment:   Intra-op Plan:   Post-operative Plan:   Informed Consent: I have reviewed the patients History and Physical, chart, labs and discussed the procedure including the risks, benefits and alternatives for the proposed anesthesia with the patient or authorized representative who has indicated his/her understanding and acceptance.   Dental advisory given  Plan Discussed with: CRNA and Surgeon  Anesthesia Plan Comments:         Anesthesia Quick Evaluation

## 2014-02-18 NOTE — Anesthesia Postprocedure Evaluation (Signed)
  Anesthesia Post-op Note  Patient: Brandi Fitzgerald  Procedure(s) Performed: Procedure(s) (LRB): LEFT TOTAL KNEE ARTHROPLASTY (Left)  Patient Location: PACU  Anesthesia Type: Spinal  Level of Consciousness: awake and alert   Airway and Oxygen Therapy: Patient Spontanous Breathing  Post-op Pain: mild  Post-op Assessment: Post-op Vital signs reviewed, Patient's Cardiovascular Status Stable, Respiratory Function Stable, Patent Airway and No signs of Nausea or vomiting  Last Vitals:  Filed Vitals:   02/18/14 1307  BP: 109/90  Pulse: 67  Temp: 36.9 C  Resp: 16    Post-op Vital Signs: stable   Complications: No apparent anesthesia complications

## 2014-02-18 NOTE — Op Note (Signed)
NAME:  Brandi Fitzgerald                      MEDICAL RECORD NO.:  [id number]                             FACILITY:  [facility]      PHYSICIAN:  Madlyn Frankel. Charlann Boxer, M.D.  DATE OF BIRTH:  1956-08-23      DATE OF PROCEDURE:  02/18/2014                                     OPERATIVE REPORT         PREOPERATIVE DIAGNOSIS:  Left knee degenerative joint disease related to rheumatoid arthritis.      POSTOPERATIVE DIAGNOSIS:  Left knee degenerative joint disease related to rheumatoid arthritis.      FINDINGS:  The patient was noted to have complete loss of cartilage and   bone-on-bone arthritis with associated osteophytes in the medial and patellofemoral compartments of   the knee with a significant synovitis and associated effusion.      PROCEDURE:  Left total knee replacement.      COMPONENTS USED:  DePuy Attune rotating platform posterior stabilized knee   system, a size 6 N femur, 4 tibia, size 6 mm AOX insert, and 32 anatomic patellar   button.      SURGEON:  Madlyn Frankel. Charlann Boxer, M.D.      ASSISTANT:  Skip Mayer, PA-C.      ANESTHESIA:  Spinal.      SPECIMENS:  None.      COMPLICATION:  None.      DRAINS:  None.  EBL: <100cc      TOURNIQUET TIME:   Total Tourniquet Time Documented: Thigh (Right) - 35 minutes Total: Thigh (Right) - 35 minutes  .      The patient was stable to the recovery room.      INDICATION FOR PROCEDURE:  Brandi Fitzgerald is a 58 y.o. female patient of   mine.  The patient had been seen, evaluated, and treated conservatively in the   office with medication, activity modification, and injections.  The patient had   radiographic changes of bone-on-bone arthritis with endplate sclerosis and osteophytes noted.      The patient failed conservative measures including medication, injections, and activity modification, and at this point was ready for more definitive measures.   Based on the radiographic changes and failed conservative measures, the patient   decided to  proceed with total knee replacement.  Risks of infection,   DVT, component failure, need for revision surgery, postop course, and   expectations were all   discussed and reviewed.  Consent was obtained for benefit of pain   relief.      PROCEDURE IN DETAIL:  The patient was brought to the operative theater.   Once adequate anesthesia, preoperative antibiotics, 900mg  of Cleocin administered, the patient was positioned supine with the left thigh tourniquet placed.  The  left lower extremity was prepped and draped in sterile fashion.  A time-   out was performed identifying the patient, planned procedure, and   extremity.      The left lower extremity was placed in the [facility] leg holder.  The leg was   exsanguinated, tourniquet elevated to 250 mmHg.  A midline incision was   made followed  by median parapatellar arthrotomy.  Following initial   exposure, attention was first directed to the patella.  Precut   measurement was noted to be 23 mm.  I resected down to 14 mm and used a   32 patellar button to restore patellar height as well as cover the cut   surface.      The lug holes were drilled and the trial button was placed to protect the   patella from retractors and saw blades.      At this point, attention was now directed to the femur.  The femoral   canal was opened with a drill, irrigated to try to prevent fat emboli.  An   intramedullary rod was passed at 3 degrees valgus, 9 mm of bone was   resected off the distal femur.  Following this resection, the tibia was   subluxated anteriorly.  Using the extramedullary guide, 2 mm of bone was resected off   the proximal medial tibia.  We confirmed the gap would be   stable medially and laterally with a size 5 insert as well as confirmed   the cut was perpendicular in the coronal plane, checking with an alignment rod.      Once this was done, I sized the femur to be a size 6 in the anterior-   posterior dimension, chose a narrow component  based on medial and   lateral dimension.  The size 6 rotation block was then pinned in   position anterior referenced using the tensioning device to set rotation and match gap tension in extension and flexion.  The   anterior, posterior, and  chamfer cuts were made without difficulty nor   notching making certain that I was along the anterior cortex to help   with flexion gap stability.      The final box cut was made off the lateral aspect of distal femur.      At this point, the tibia was sized to be a size 4, the size 4 tray was   then pinned in position through the medial third of the tubercle,   drilled, and keel punched.  Trial reduction was now carried with a 6 femur,  4 tibia, a size 6 insert, and the 32 patella botton.  The knee was brought to   extension, full extension with good flexion stability with the patella   tracking through the trochlea without application of pressure.  Given   all these findings, the trial components removed.  Final components were   opened and cement was mixed.  The knee was irrigated with normal saline   solution and pulse lavage.  The synovial lining was   then injected with 0.25% Marcaine with epinephrine and 1 cc of Toradol,   total of 61 cc.      The knee was irrigated.  Final implants were then cemented onto clean and   dried cut surfaces of bone with the knee brought to extension with a size 6 trial insert.      Once the cement had fully cured, the excess cement was removed   throughout the knee.  I confirmed I was satisfied with the range of   motion and stability, and the final size 6 AOX insert was chosen.  It was   placed into the knee.      The tourniquet had been let down at 35 minutes.  No significant   hemostasis required.  The   extensor mechanism was then reapproximated  using #1 Vicryl with the knee   in flexion.  The   remaining wound was closed with 2-0 Vicryl and running 4-0 Monocryl.   The knee was cleaned, dried, dressed  sterilely using Dermabond and   Aquacel dressing.  The patient was then   brought to recovery room in stable condition, tolerating the procedure   well.   Please note that Physician Assistant, Skip Mayer, was present for the entirety of the case, and was utilized for pre-operative positioning, peri-operative retractor management, general facilitation of the procedure.  He was also utilized for primary wound closure at the end of the case.              Madlyn Frankel Charlann Boxer, M.D.    02/18/2014 11:26 AM

## 2014-02-18 NOTE — Transfer of Care (Signed)
Immediate Anesthesia Transfer of Care Note  Patient: Brandi Fitzgerald  Procedure(s) Performed: Procedure(s): LEFT TOTAL KNEE ARTHROPLASTY (Left)  Patient Location: PACU  Anesthesia Type:Spinal  Level of Consciousness: awake, alert  and oriented  Airway & Oxygen Therapy: Patient Spontanous Breathing and Patient connected to face mask oxygen  Post-op Assessment: Report given to PACU RN and Post -op Vital signs reviewed and stable  Post vital signs: Reviewed and stable  Complications: No apparent anesthesia complications

## 2014-02-18 NOTE — Anesthesia Procedure Notes (Signed)

## 2014-02-18 NOTE — Interval H&P Note (Signed)
History and Physical Interval Note:  02/18/2014 8:47 AM  Brandi Fitzgerald  has presented today for surgery, with the diagnosis of LEFT KNEE OA  The various methods of treatment have been discussed with the patient and family. After consideration of risks, benefits and other options for treatment, the patient has consented to  Procedure(s): LEFT TOTAL KNEE ARTHROPLASTY (Left) as a surgical intervention .  The patient's history has been reviewed, patient examined, no change in status, stable for surgery.  I have reviewed the patient's chart and labs.  Questions were answered to the patient's satisfaction.     Shelda Pal

## 2014-02-18 NOTE — Evaluation (Addendum)
Physical Therapy Evaluation Patient Details Name: Brandi Fitzgerald MRN: [id number] DOB: June 02, 1956 Today's Date: 02/18/2014   History of Present Illness  L TKA  Clinical Impression  *Pt is s/p TKA resulting in the deficits listed below (see PT Problem List). * Pt will benefit from skilled PT to increase their independence and safety with mobility to allow discharge to the venue listed below.  **    Follow Up Recommendations Home health PT    Equipment Recommendations  Rolling walker with 5" wheels    Recommendations for Other Services       Precautions / Restrictions Precautions Precautions: Knee Restrictions Weight Bearing Restrictions: No Other Position/Activity Restrictions: WBAT      Mobility  Bed Mobility Overal bed mobility: Needs Assistance Min assist to support LLE for supine to sit                Transfers Overall transfer level: Needs assistance Equipment used: Rolling walker (2 wheeled) Transfers: Sit to/from Stand Sit to Stand: Min assist         General transfer comment: assist to rise, cues for hand placement/safety  Ambulation/Gait Ambulation/Gait assistance: Min guard Ambulation Distance (Feet): 120 Feet Assistive device: Rolling walker (2 wheeled) Gait Pattern/deviations: Step-to pattern   Gait velocity interpretation: Below normal speed for age/gender General Gait Details: cues for sequencing  Stairs            Wheelchair Mobility    Modified Rankin (Stroke Patients Only)       Balance Overall balance assessment: Needs assistance Sitting-balance support: Feet supported;Single extremity supported Sitting balance-Leahy Scale: Good       Standing balance-Leahy Scale: Poor                               Pertinent Vitals/Pain **7/10 L knee Premedicated Ice applied*    Home Living Family/patient expects to be discharged to:: Private residence Living Arrangements: Spouse/significant other     Home  Access: Stairs to enter   Secretary/administrator of Steps: 3   Home Equipment: Cane - single point Additional Comments: spouse only able to provide min A 2* extensive cardiac hx    Prior Function Level of Independence: Independent with assistive device(s)               Hand Dominance        Extremity/Trunk Assessment   Upper Extremity Assessment: Generalized weakness (h/o RA, strength -4/5 grossly BUEs)           Lower Extremity Assessment: LLE deficits/detail   LLE Deficits / Details: L knee flexion AAROM 40*, ankle 5/5  Cervical / Trunk Assessment: Normal  Communication   Communication: No difficulties  Cognition Arousal/Alertness: Awake/alert Behavior During Therapy: WFL for tasks assessed/performed Overall Cognitive Status: Within Functional Limits for tasks assessed                      General Comments      Exercises Total Joint Exercises Ankle Circles/Pumps: AROM;Both;10 reps;Supine Quad Sets: AROM;Both;10 reps;Supine Heel Slides: AAROM;Left;10 reps;Supine      Assessment/Plan    PT Assessment Patient needs continued PT services  PT Diagnosis Difficulty walking;Acute pain   PT Problem List Decreased strength;Decreased range of motion;Decreased activity tolerance;Pain;Decreased mobility;Decreased safety awareness;Decreased knowledge of use of DME  PT Treatment Interventions Gait training;DME instruction;Stair training;Therapeutic activities;Therapeutic exercise;Patient/family education   PT Goals (Current goals can be found in the Care Plan section) Acute  Rehab PT Goals Patient Stated Goal: gardening, visiting grandkids in [place] PT Goal Formulation: With patient Time For Goal Achievement: 03/04/14 Potential to Achieve Goals: Good    Frequency 7X/week   Barriers to discharge Decreased caregiver support      Co-evaluation               End of Session Equipment Utilized During Treatment: Gait belt Activity Tolerance: Patient  tolerated treatment well Patient left: in chair;with call bell/phone within reach Nurse Communication: Mobility status         Time: 8850-2774 PT Time Calculation (min): 30 min   Charges:   PT Evaluation $Initial PT Evaluation Tier I: 1 Procedure PT Treatments $Gait Training: 8-22 mins   PT G CodesAlvester Morin 02/18/2014, 4:30 PM [phone]

## 2014-02-18 NOTE — H&P (View-Only) (Signed)
Brandi Fitzgerald is an 58 y.o. female.    Chief Complaint: left knee pain  HPI: Pt is a 58 y.o. female complaining of left knee pain for multiple years. Pain had continually increased since the beginning. X-rays in the clinic show end-stage arthritic changes of the left knee. Pt has tried various conservative treatments which have failed to alleviate their symptoms, including injections and therapy. Various options are discussed with the patient. Risks, benefits and expectations were discussed with the patient. Patient understand the risks, benefits and expectations and wishes to proceed with surgery.   PCP:  NIDA,GEBRESELASSIE, MD  D/C Plans:  SNF/Rehab  PMH: Past Medical History  Diagnosis Date  . Hypertension   . Diabetes mellitus   . High cholesterol     PSH: Past Surgical History  Procedure Laterality Date  . Abdominal hysterectomy    . Foot arthroplasty      Social History:  reports that she has never smoked. She does not have any smokeless tobacco history on file. She reports that she drinks alcohol. She reports that she does not use illicit drugs.  Allergies:  Allergies  Allergen Reactions  . Januvia [Sitagliptin Phosphate] Anaphylaxis  . Aspirin     Cant remember  . Cephalosporins Swelling  . Penicillins Itching and Swelling    Medications: Current Outpatient Prescriptions  Medication Sig Dispense Refill  . acidophilus (RISAQUAD) CAPS capsule Take 1 capsule by mouth daily.      Marland Kitchen esomeprazole (NEXIUM) 40 MG capsule Take 40 mg by mouth daily at 12 noon.      . Estradiol (VAGIFEM) 10 MCG TABS vaginal tablet Place 1 tablet vaginally 2 (two) times a week.      . etanercept (ENBREL) 25 MG injection Inject 50 mg into the skin once a week.      Marland Kitchen HYDROcodone-acetaminophen (NORCO/VICODIN) 5-325 MG per tablet Take 1 tablet by mouth every 6 (six) hours as needed for moderate pain.      Marland Kitchen ipratropium (ATROVENT) 0.03 % nasal spray Place 2 sprays into both nostrils daily.       Marland Kitchen lisinopril (PRINIVIL,ZESTRIL) 20 MG tablet Take 20 mg by mouth every morning.      . metFORMIN (GLUCOPHAGE) 500 MG tablet Take 500 mg by mouth daily with breakfast.      . montelukast (SINGULAIR) 10 MG tablet Take 10 mg by mouth daily.      . predniSONE (DELTASONE) 5 MG tablet Take 5 mg by mouth daily with breakfast.      . rosuvastatin (CRESTOR) 20 MG tablet Take 20 mg by mouth every morning.      . traMADol (ULTRAM) 50 MG tablet Take 50 mg by mouth every 6 (six) hours as needed for moderate pain.      . valACYclovir (VALTREX) 500 MG tablet Take 500 mg by mouth daily.       No current facility-administered medications for this visit.    No results found for this or any previous visit (from the past 48 hour(s)). No results found.  ROS: Pain with rom of the left lower extremity  Physical Exam: BP:   190/110  ;  HR:   84  ; Resp:   14  : Alert and oriented 58 y.o. female in no acute distress Cranial nerves 2-12 intact Cervical spine: full rom with no tenderness, nv intact distally Chest: active breath sounds bilaterally, no wheeze rhonchi or rales Heart: regular rate and rhythm, no murmur Abd: non tender non distended with active bowel  sounds Hip is stable with rom  Pain with rom of bilateral knees nv intact distally No rashes or edema Moderately antalgic gait  Assessment/Plan Assessment: left knee end stage osteoarthritis  Plan: Patient will undergo a left total knee arthroplasty by Dr. Olin at [facility]. Risks benefits and expectations were discussed with the patient. Patient understand risks, benefits and expectations and wishes to proceed.   

## 2014-02-19 DIAGNOSIS — D500 Iron deficiency anemia secondary to blood loss (chronic): Secondary | ICD-10-CM | POA: Diagnosis not present

## 2014-02-19 DIAGNOSIS — E669 Obesity, unspecified: Secondary | ICD-10-CM | POA: Diagnosis present

## 2014-02-19 LAB — CBC
HEMATOCRIT: 31.3 % — AB (ref 36.0–46.0)
HEMOGLOBIN: 10.2 g/dL — AB (ref 12.0–15.0)
MCH: 29.2 pg (ref 26.0–34.0)
MCHC: 32.6 g/dL (ref 30.0–36.0)
MCV: 89.7 fL (ref 78.0–100.0)
Platelets: 423 10*3/uL — ABNORMAL HIGH (ref 150–400)
RBC: 3.49 MIL/uL — ABNORMAL LOW (ref 3.87–5.11)
RDW: 14.9 % (ref 11.5–15.5)
WBC: 14.1 10*3/uL — ABNORMAL HIGH (ref 4.0–10.5)

## 2014-02-19 LAB — GLUCOSE, CAPILLARY
Glucose-Capillary: 139 mg/dL — ABNORMAL HIGH (ref 70–99)
Glucose-Capillary: 121 mg/dL — ABNORMAL HIGH (ref 70–99)
Glucose-Capillary: 119 mg/dL — ABNORMAL HIGH (ref 70–99)
Glucose-Capillary: 107 mg/dL — ABNORMAL HIGH (ref 70–99)

## 2014-02-19 LAB — BASIC METABOLIC PANEL
BUN: 12 mg/dL (ref 6–23)
CALCIUM: 9 mg/dL (ref 8.4–10.5)
CO2: 28 mEq/L (ref 19–32)
CREATININE: 0.93 mg/dL (ref 0.50–1.10)
Chloride: 101 mEq/L (ref 96–112)
GFR, EST AFRICAN AMERICAN: 76 mL/min — AB (ref >90)
GFR, EST NON AFRICAN AMERICAN: 66 mL/min — AB (ref >90)
GLUCOSE: 107 mg/dL — AB (ref 70–99)
Potassium: 4.8 mEq/L (ref 3.7–5.3)
Sodium: 139 mEq/L (ref 137–147)

## 2014-02-19 NOTE — Progress Notes (Signed)
CSW consulted for SNF placement. Pt feels that ST Rehab will not be needed following hospital d/c. Pt plans to return home with [facility] services. RNCM is aware and will assist with d/c planning. SNF bed offers are available if pt's status changes and SNF placement is needed.  Cori Razor LCSW [phone]

## 2014-02-19 NOTE — Progress Notes (Signed)
Physical Therapy Treatment Patient Details Name: Brandi Fitzgerald MRN: [id number] DOB: 1956-10-02 Today's Date: 02/19/2014    History of Present Illness L TKA    PT Comments    POD # 1 pm session.  Pt c/o increased pain this afternoon vs am.  Amb limited distance in hallway then assisted back to bed per pt request.   Follow Up Recommendations  Home health PT [facility])     Equipment Recommendations  Rolling walker with 5" wheels    Recommendations for Other Services       Precautions / Restrictions Precautions Precautions: Knee Restrictions Weight Bearing Restrictions: No Other Position/Activity Restrictions: WBAT    Mobility  Bed Mobility Overal bed mobility: Needs Assistance Bed Mobility: Sit to Supine       Sit to supine: Min assist   General bed mobility comments: min assist to support L LE up onto bed  Transfers Overall transfer level: Needs assistance Equipment used: Rolling walker (2 wheeled) Transfers: Sit to/from Stand Sit to Stand: Supervision;From elevated surface         General transfer comment: cues for UE placement and safety with turn completion  Ambulation/Gait Ambulation/Gait assistance: Supervision;Min guard Ambulation Distance (Feet): 105 Feet Assistive device: Rolling walker (2 wheeled) Gait Pattern/deviations: Step-to pattern;Decreased stance time - left Gait velocity: decreased   General Gait Details: cues for sequencing, posture and position from [employer]            Wheelchair Mobility    Modified Rankin (Stroke Patients Only)       Balance                                    Cognition                            Exercises      General Comments        Pertinent Vitals/Pain C/o 6/10 pain ICE applied    Home Living                      Prior Function            PT Goals (current goals can now be found in the care plan section) Progress towards PT  goals: Progressing toward goals    Frequency  7X/week    PT Plan      Co-evaluation             End of Session Equipment Utilized During Treatment: Gait belt Activity Tolerance: Patient tolerated treatment well Patient left: in bed;with call bell/phone within reach     Time: 1455-1510 PT Time Calculation (min): 15 min  Charges:  $Gait Training: 8-22 mins                    G Codes:     Felecia Shelling  PTA [facility]  Acute  Rehab Pager      [phone]

## 2014-02-19 NOTE — Evaluation (Signed)
Occupational Therapy Evaluation Patient Details Name: Brandi Fitzgerald MRN: [id number] DOB: 28-Mar-1956 Today's Date: 02/19/2014    History of Present Illness L TKA   Clinical Impression   Pt was admitted for the above surgery.  She is at a supervision/set up level for adls.  Will follow her in acute to further educate her on tub bench transfers.     Follow Up Recommendations  No OT follow up    Equipment Recommendations   (? tub bench--will further educate)    Recommendations for Other Services       Precautions / Restrictions Precautions Precautions: Knee Restrictions Other Position/Activity Restrictions: WBAT      Mobility Bed Mobility         Supine to sit: Supervision;HOB elevated     General bed mobility comments: used bil UEs to assist LLE and used rails  Transfers       Sit to Stand: Supervision;From elevated surface         General transfer comment: cues for UE placement    Balance                                            ADL Overall ADL's : Needs assistance/impaired                                       General ADL Comments: performed adl with set up /supervision from eob to standing.  Ambulated to bathroom with supervision, cues for sequence to perform make up at sink.  Able to regular clothes including socks, tennis shoes and tied them.  Educated on tub transfer bench.  Will bring tomorrow to show her.  She doesn't think her 3:1 wil fit over her toilet.       Vision                     Perception     Praxis      Pertinent Vitals/Pain No more pain than baseline per pt. (h/o RA)     Hand Dominance     Extremity/Trunk Assessment Upper Extremity Assessment Upper Extremity Assessment: Overall WFL for tasks assessed (h/o RA)           Communication Communication Communication: No difficulties   Cognition Arousal/Alertness: Awake/alert Behavior During Therapy: WFL for tasks  assessed/performed Overall Cognitive Status: Within Functional Limits for tasks assessed                     General Comments       Exercises       Shoulder Instructions      Home Living Family/patient expects to be discharged to:: Private residence Living Arrangements: Spouse/significant other     Home Access: Stairs to enter Secretary/administrator of Steps: 3         Bathroom Shower/Tub: Chief Strategy Officer: Standard     Home Equipment: Bedside commode   Additional Comments: spouse only able to provide min A 2* extensive cardiac hx      Prior Functioning/Environment Level of Independence: Independent with assistive device(s)             OT Diagnosis: Generalized weakness   OT Problem List: Decreased knowledge of use of DME or AE   OT Treatment/Interventions: Self-care/ADL  training;DME and/or AE instruction    OT Goals(Current goals can be found in the care plan section) Acute Rehab OT Goals OT Goal Formulation: With patient/family Time For Goal Achievement: 02/26/14 Potential to Achieve Goals: Good ADL Goals Pt Will Perform Tub/Shower Transfer: Tub transfer;Stand pivot transfer;tub bench (min A vs. verbalize)  OT Frequency: Min 2X/week   Barriers to D/C:            Co-evaluation              End of Session Nurse Communication:  (check on pt: she may want to return to sink)  Activity Tolerance: Patient tolerated treatment well Patient left: in bed;with call bell/phone within reach   Time: 4481-8563 OT Time Calculation (min): 49 min Charges:  OT General Charges $OT Visit: 1 Procedure OT Evaluation $Initial OT Evaluation Tier I: 1 Procedure OT Treatments $Self Care/Home Management : 38-52 mins G-Codes:    Marica Otter 03/01/2014, 9:05 AM Marica Otter, OTR/L [phone] 03/01/2014

## 2014-02-19 NOTE — Progress Notes (Signed)
   Subjective: 1 Day Post-Op Procedure(s) (LRB): LEFT TOTAL KNEE ARTHROPLASTY (Left)   Patient reports pain as mild, pain controlled well. No events throughout the night. Denies any CP or SOB. We have discussed home vs a facility and she would prefer to go home.   Objective:   VITALS:   Filed Vitals:   02/19/14 0541  BP: 118/78  Pulse: 70  Temp: 97.9 F (36.6 C)  Resp: 16    Neurovascular intact Dorsiflexion/Plantar flexion intact Incision: dressing C/D/I No cellulitis present Compartment soft  LABS  Recent Labs  02/19/14 0440  HGB 10.2*  HCT 31.3*  WBC 14.1*  PLT 423*     Recent Labs  02/19/14 0440  NA 139  K 4.8  BUN 12  CREATININE 0.93  GLUCOSE 107*     Assessment/Plan: 1 Day Post-Op Procedure(s) (LRB): LEFT TOTAL KNEE ARTHROPLASTY (Left) Foley cath d/c'ed Advance diet Up with therapy D/C IV fluids Discharge home with home health eventually, possibly Thursday  Expected ABLA  Treated with iron and will observe  Obese (BMI 30-39.9) Estimated body mass index is 33.98 kg/(m^2) as calculated from the following:   Height as of this encounter: 5\' 7"  (1.702 m).   Weight as of this encounter: 98.433 kg (217 lb 0.1 oz). Patient also counseled that weight may inhibit the healing process Patient counseled that losing weight will help with future health issues        . Brandi Fitzgerald   PAC  02/19/2014, 9:19 AM

## 2014-02-19 NOTE — Care Management Note (Addendum)
    Page 1 of 2   02/20/2014     11:59:32 AM CARE MANAGEMENT NOTE 02/20/2014  Patient:  Brandi Fitzgerald, Brandi Fitzgerald   Account Number:  [id number]  Date Initiated:  02/19/2014  Documentation initiated by:  [facility]  Subjective/Objective Assessment:   adm: L knee pain; L TKA     Action/Plan:   discharge planning   Anticipated DC Date:  02/20/2014   Anticipated DC Plan:  [place]  CM consult      [facility] Choice  HOME HEALTH   Choice offered to / List presented to:  C-1 Patient   DME arranged  [place] arranged  HH-2 PT      [place] agency  [place]   Status of service:  Completed, signed off Medicare Important Message given?   (If response is "NO", the following Medicare IM given date fields will be blank) Date Medicare IM given:   Date Additional Medicare IM given:    Discharge Disposition:  [place]  Per UR Regulation:  Reviewed for med. necessity/level of care/duration of stay  If discussed at [place] of Stay Meetings, dates discussed:    Comments:  02/20/14 Blayton Huttner RN,BSN NCM [place] D/C HOME W/HHPT.LEFT VM W/[facility] ABOUT D/C HOME TODAY W/HHPT,LEFT CALL BACK#.FAXED W/CONFIRMATION TO FAX#[phone]-HHPT ORDER,D/C ORDER,FACE 2 FACE.  02/19/14 10:25 CM met with pt and spouse in room to confirm choice.  Pt chooses [facility]) to render HHPT. Address and contact information verified with pt.   CM called [facility] for referral; Ewell Poe accepted referral with request to fax facesheet, order, PT eval note, OP note, DC note, H&P, and F2F. CM faxed the aforementioned to [facility].  DME delivery rep notified and rolling walker and shower stool will be delivered to room prior to discharge.  No other CM needs were communicated. Mariane Masters, BSN, Jearld Lesch [phone]. 02/19/14  08:00 CM spoke with pt in room and gave her a list of  home health agencies in her area.  Pt states she wants to research this and will get back to me.  Will continue to monitor.  Mariane Masters, BSN, CM [phone].

## 2014-02-19 NOTE — Progress Notes (Signed)
Physical Therapy Treatment Patient Details Name: Mira Balon MRN: [id number] DOB: August 24, 1956 Today's Date: 03-15-2014    History of Present Illness L TKA    PT Comments      Follow Up Recommendations  Home health PT     Equipment Recommendations  Rolling walker with 5" wheels    Recommendations for Other Services OT consult     Precautions / Restrictions Precautions Precautions: Knee Restrictions Weight Bearing Restrictions: No Other Position/Activity Restrictions: WBAT    Mobility  Bed Mobility Overal bed mobility: Needs Assistance Bed Mobility: Supine to Sit     Supine to sit: Supervision;HOB elevated     General bed mobility comments: used bil UEs to assist LLE and used rails  Transfers Overall transfer level: Needs assistance Equipment used: Rolling walker (2 wheeled) Transfers: Sit to/from Stand Sit to Stand: Supervision;From elevated surface         General transfer comment: cues for UE placement  Ambulation/Gait Ambulation/Gait assistance: Min guard Ambulation Distance (Feet): 140 Feet Assistive device: Rolling walker (2 wheeled) Gait Pattern/deviations: Step-to pattern;Shuffle;Antalgic;Trunk flexed;Decreased step length - right;Decreased step length - left     General Gait Details: cues for sequencing, posture and position from [employer]            Wheelchair Mobility    Modified Rankin (Stroke Patients Only)       Balance                                    Cognition Arousal/Alertness: Awake/alert Behavior During Therapy: WFL for tasks assessed/performed Overall Cognitive Status: Within Functional Limits for tasks assessed                      Exercises Total Joint Exercises Ankle Circles/Pumps: AROM;Both;Supine;15 reps Quad Sets: AROM;Both;Supine;15 reps Heel Slides: AAROM;Left;Supine;15 reps Straight Leg Raises: AAROM;AROM;15 reps;Supine;Left    General Comments        Pertinent  Vitals/Pain 5/10; premed, ice packs provided    Home Living Family/patient expects to be discharged to:: Private residence Living Arrangements: Spouse/significant other     Home Access: Stairs to enter     Home Equipment: Bedside commode Additional Comments: spouse only able to provide min A 2* extensive cardiac hx    Prior Function Level of Independence: Independent with assistive device(s)          PT Goals (current goals can now be found in the care plan section) Acute Rehab PT Goals Patient Stated Goal: gardening, visiting grandkids in [place] PT Goal Formulation: With patient Time For Goal Achievement: 03/04/14 Potential to Achieve Goals: Good Progress towards PT goals: Progressing toward goals    Frequency  7X/week    PT Plan Current plan remains appropriate    Co-evaluation             End of Session Equipment Utilized During Treatment: Gait belt Activity Tolerance: Patient tolerated treatment well Patient left: in chair;with call bell/phone within reach     Time: 1052-1126 PT Time Calculation (min): 34 min  Charges:  $Gait Training: 8-22 mins $Therapeutic Exercise: 8-22 mins                    G Codes:      Brien Few 03-15-14, 11:58 AM

## 2014-02-19 NOTE — Discharge Instructions (Signed)
Information on my medicine - XARELTO (Rivaroxaban)  This medication education was reviewed with me or my healthcare representative as part of my discharge preparation.  The pharmacist that spoke with me during my hospital stay was:  Winfield Rast, RPH  Why was Xarelto prescribed for you? Xarelto was prescribed for you to reduce the risk of blood clots forming after orthopedic surgery. The medical term for these abnormal blood clots is venous thromboembolism (VTE).  What do you need to know about xarelto ? Take your Xarelto ONCE DAILY at the same time every day. You may take it either with or without food.  If you have difficulty swallowing the tablet whole, you may crush it and mix in applesauce just prior to taking your dose.  Take Xarelto exactly as prescribed by your doctor and DO NOT stop taking Xarelto without talking to the doctor who prescribed the medication.  Stopping without other VTE prevention medication to take the place of Xarelto may increase your risk of developing a clot.  After discharge, you should have regular check-up appointments with your healthcare provider that is prescribing your Xarelto.    What do you do if you miss a dose? If you miss a dose, take it as soon as you remember on the same day then continue your regularly scheduled once daily regimen the next day. Do not take two doses of Xarelto on the same day.   Important Safety Information A possible side effect of Xarelto is bleeding. You should call your healthcare provider right away if you experience any of the following:   Bleeding from an injury or your nose that does not stop.   Unusual colored urine (red or dark brown) or unusual colored stools (red or black).   Unusual bruising for unknown reasons.   A serious fall or if you hit your head (even if there is no bleeding).  Some medicines may interact with Xarelto and might increase your risk of bleeding while on Xarelto. To help avoid  this, consult your healthcare provider or pharmacist prior to using any new prescription or non-prescription medications, including herbals, vitamins, non-steroidal anti-inflammatory drugs (NSAIDs) and supplements.  This website has more information on Xarelto: VisitDestination.com.br.

## 2014-02-20 LAB — BASIC METABOLIC PANEL
BUN: 10 mg/dL (ref 6–23)
CALCIUM: 8.9 mg/dL (ref 8.4–10.5)
CHLORIDE: 100 meq/L (ref 96–112)
CO2: 29 mEq/L (ref 19–32)
CREATININE: 0.96 mg/dL (ref 0.50–1.10)
GFR, EST AFRICAN AMERICAN: 74 mL/min — AB (ref >90)
GFR, EST NON AFRICAN AMERICAN: 63 mL/min — AB (ref >90)
Glucose, Bld: 101 mg/dL — ABNORMAL HIGH (ref 70–99)
Potassium: 4.2 mEq/L (ref 3.7–5.3)
Sodium: 140 mEq/L (ref 137–147)

## 2014-02-20 LAB — CBC
HCT: 29.8 % — ABNORMAL LOW (ref 36.0–46.0)
Hemoglobin: 9.6 g/dL — ABNORMAL LOW (ref 12.0–15.0)
MCH: 29 pg (ref 26.0–34.0)
MCHC: 32.2 g/dL (ref 30.0–36.0)
MCV: 90 fL (ref 78.0–100.0)
Platelets: 407 10*3/uL — ABNORMAL HIGH (ref 150–400)
RBC: 3.31 MIL/uL — ABNORMAL LOW (ref 3.87–5.11)
RDW: 15 % (ref 11.5–15.5)
WBC: 13.5 10*3/uL — ABNORMAL HIGH (ref 4.0–10.5)

## 2014-02-20 LAB — GLUCOSE, CAPILLARY: Glucose-Capillary: 99 mg/dL (ref 70–99)

## 2014-02-20 MED ORDER — HYDROCODONE-ACETAMINOPHEN 7.5-325 MG PO TABS: 1.0000 | ORAL_TABLET | ORAL | Status: DC

## 2014-02-20 MED ORDER — METHOCARBAMOL 500 MG PO TABS: 500.0000 mg | ORAL_TABLET | Freq: Four times a day (QID) | ORAL | Status: DC | PRN

## 2014-02-20 MED ORDER — DSS 100 MG PO CAPS: 100.0000 mg | ORAL_CAPSULE | Freq: Two times a day (BID) | ORAL | Status: DC

## 2014-02-20 MED ORDER — FERROUS SULFATE 325 (65 FE) MG PO TABS: 325.0000 mg | ORAL_TABLET | Freq: Three times a day (TID) | ORAL | Status: DC

## 2014-02-20 MED ORDER — RIVAROXABAN 10 MG PO TABS: 10.0000 mg | ORAL_TABLET | ORAL | Status: DC

## 2014-02-20 MED ORDER — POLYETHYLENE GLYCOL 3350 17 G PO PACK: 17.0000 g | PACK | Freq: Two times a day (BID) | ORAL | Status: DC

## 2014-02-20 NOTE — Progress Notes (Signed)
Physical Therapy Treatment Patient Details Name: Maclovia Uher MRN: [id number] DOB: November 20, 1955 Today's Date: 02/20/2014    History of Present Illness L TKA    PT Comments    POD # 2 pt ready for D/C to home today after therapy.  Assisted OOB to amb to BR.  Assisted with amb in hallway.  Assisted with negotiating 2 steps with B rails.  Instructed on HEP and given hand out HEP and performed all supine TE's.  Instructed on use of ICE.  Follow Up Recommendations  Home health PT     Equipment Recommendations  Rolling walker with 5" wheels    Recommendations for Other Services       Precautions / Restrictions Precautions Precautions: Knee Restrictions Weight Bearing Restrictions: No Other Position/Activity Restrictions: WBAT    Mobility  Bed Mobility               General bed mobility comments: Pt OOB in recliner  Transfers Overall transfer level: Needs assistance Equipment used: Rolling walker (2 wheeled) Transfers: Sit to/from Stand Sit to Stand: Min guard         General transfer comment: cues for UE placement and safety with turn completion  Ambulation/Gait Ambulation/Gait assistance: Supervision;Min guard Ambulation Distance (Feet): 45 Feet Assistive device: Rolling walker (2 wheeled) Gait Pattern/deviations: Step-to pattern;Decreased stance time - left Gait velocity: decreased   General Gait Details: cues for sequencing, posture and position from RW   Stairs Stairs: Yes Stairs assistance: Min guard Stair Management: Two rails Number of Stairs: 2 General stair comments: 25% VC's on proper sequencing and safety  Wheelchair Mobility    Modified Rankin (Stroke Patients Only)       Balance                                    Cognition                            Exercises      General Comments        Pertinent Vitals/Pain C/o "soreness' Pre medicated ICE applied    Home Living                       Prior Function            PT Goals (current goals can now be found in the care plan section) Progress towards PT goals: Progressing toward goals    Frequency  7X/week    PT Plan      Co-evaluation             End of Session Equipment Utilized During Treatment: Gait belt Activity Tolerance: Patient tolerated treatment well Patient left: in chair;with call bell/phone within reach     Time: 0920-1000 PT Time Calculation (min): 40 min  Charges:  $Gait Training: 8-22 mins $Therapeutic Exercise: 8-22 mins $Therapeutic Activity: 8-22 mins                    G Codes:      Felecia Shelling  PTA [facility]  Acute  Rehab Pager      [phone]

## 2014-02-20 NOTE — Progress Notes (Signed)
Patient ID: Brandi Fitzgerald, female   DOB: 17-Jul-1956, 58 y.o.   MRN: [id number] Subjective: 2 Days Post-Op Procedure(s) (LRB): LEFT TOTAL KNEE ARTHROPLASTY (Left)    Patient reports pain as moderate.  Feels that she may have done too much yesterday but otherwise OK, no events  Objective:   VITALS:   Filed Vitals:   02/20/14 0621  BP: 138/72  Pulse: 80  Temp: 98.2 F (36.8 C)  Resp: 20    Neurovascular intact Incision: dressing C/D/I  LABS  Recent Labs  02/19/14 0440 02/20/14 0410  HGB 10.2* 9.6*  HCT 31.3* 29.8*  WBC 14.1* 13.5*  PLT 423* 407*     Recent Labs  02/19/14 0440 02/20/14 0410  NA 139 140  K 4.8 4.2  BUN 12 10  CREATININE 0.93 0.96  GLUCOSE 107* 101*    No results found for this basename: LABPT, INR,  in the last 72 hours   Assessment/Plan: 2 Days Post-Op Procedure(s) (LRB): LEFT TOTAL KNEE ARTHROPLASTY (Left)   Up with therapy Discharge home with home health today after therapy

## 2014-02-20 NOTE — Progress Notes (Signed)
OT Cancellation Note  Patient Details Name: Brandi Fitzgerald MRN: [id number] DOB: 1956/05/14   Cancelled Treatment:    Reason Eval/Treat Not Completed: Other (comment).  Checked with pt and she has decided she's not interested in tub bench.  Will sign off.  Marica Otter 02/20/2014, 7:52 AM Marica Otter, OTR/L [phone] 02/20/2014

## 2014-02-21 NOTE — Discharge Summary (Signed)
Physician Discharge Summary  Patient ID: Brandi Fitzgerald MRN: [id number] DOB/AGE: Mar 09, 1956 58 y.o.  Admit date: 02/18/2014 Discharge date: 02/20/2014   Procedures:  Procedure(s) (LRB): LEFT TOTAL KNEE ARTHROPLASTY (Left)  Attending Physician:  Dr. Durene Romans   Admission Diagnoses:   Left knee OA / pain  Discharge Diagnoses:  Principal Problem:   S/P left TKA Active Problems:   Obese   Expected blood loss anemia  Past Medical History  Diagnosis Date  . Hypertension   . Diabetes mellitus   . High cholesterol   . Arthritis     HPI: Pt is a 58 y.o. female complaining of left knee pain for multiple years. Pain had continually increased since the beginning. X-rays in the clinic show end-stage arthritic changes of the left knee. Pt has tried various conservative treatments which have failed to alleviate their symptoms, including injections and therapy. Various options are discussed with the patient. Risks, benefits and expectations were discussed with the patient. Patient understand the risks, benefits and expectations and wishes to proceed with surgery.   PCP: NIDA,GEBRESELASSIE, MD   Discharged Condition: good  Hospital Course:  Patient underwent the above stated procedure on 02/18/2014. Patient tolerated the procedure well and brought to the recovery room in good condition and subsequently to the floor.  POD #1 BP: 118/78 ; Pulse: 70 ; Temp: 97.9 F (36.6 C) ; Resp: 16 Patient reports pain as mild, pain controlled well. No events throughout the night. Denies any CP or SOB. We have discussed home vs a facility and she would prefer to go home.  Neurovascular intact, dorsiflexion/plantar flexion intact, incision: dressing C/D/I, no cellulitis present and compartment soft.   LABS  Basename    HGB  10.2  HCT  31.3   POD #2  BP: 138/72 ; Pulse: 80 ; Temp: 98.2 F (36.8 C) ; Resp: 20  Patient reports pain as moderate. Feels that she may have done too much yesterday but  otherwise OK, no events. Neurovascular intact, dorsiflexion/plantar flexion intact, incision: dressing C/D/I, no cellulitis present and compartment soft.   LABS  Basename    HGB  9.6  HCT  29.8    Discharge Exam: General appearance: alert, cooperative and no distress Extremities: Homans sign is negative, no sign of DVT, no edema, redness or tenderness in the calves or thighs and no ulcers, gangrene or trophic changes  Disposition:   Home with follow up in 2 weeks   Follow-up Information   Follow up with [facility] (home health physical therapy)    Contact information:   [phone]      Follow up with [facility]. Adult nurse and shower stool)    Contact information:   [address] [place] [address] [phone]       Follow up with Shelda Pal, MD. Schedule an appointment as soon as possible for a visit in 2 weeks.   Specialty:  Orthopedic Surgery   Contact information:   [address] [place] [address] [phone]       Discharge Instructions   Call MD / Call 911    Complete by:  As directed   If you experience chest pain or shortness of breath, CALL 911 and be transported to the hospital emergency room.  If you develope a fever above 101 F, pus (white drainage) or increased drainage or redness at the wound, or calf pain, call your surgeon's office.  Change dressing    Complete by:  As directed   Maintain surgical dressing for 10-14 days, or until follow up in the clinic.     Constipation Prevention    Complete by:  As directed   Drink plenty of fluids.  Prune juice may be helpful.  You may use a stool softener, such as Colace (over the counter) 100 mg twice a day.  Use MiraLax (over the counter) for constipation as needed.     Diet - low sodium heart healthy    Complete by:  As directed      Discharge instructions    Complete by:  As directed   Maintain surgical dressing for  10-14 days, or until follow up in the clinic. Follow up in 2 weeks at [facility]. Call with any questions or concerns.     Increase activity slowly as tolerated    Complete by:  As directed      TED hose    Complete by:  As directed   Use stockings (TED hose) for 2 weeks on both leg(s).  You may remove them at night for sleeping.     Weight bearing as tolerated    Complete by:  As directed              Medication List    STOP taking these medications       HYDROcodone-acetaminophen 5-325 MG per tablet  Commonly known as:  NORCO/VICODIN  Replaced by:  HYDROcodone-acetaminophen 7.5-325 MG per tablet     traMADol 50 MG tablet  Commonly known as:  ULTRAM     VAGIFEM 10 MCG Tabs vaginal tablet  Generic drug:  Estradiol      TAKE these medications       acidophilus Caps capsule  Take 1 capsule by mouth daily.     ALPRAZolam 0.5 MG tablet  Commonly known as:  XANAX  Take 0.5 mg by mouth 2 (two) times daily as needed for anxiety.     DSS 100 MG Caps  Take 100 mg by mouth 2 (two) times daily.     esomeprazole 40 MG capsule  Commonly known as:  NEXIUM  Take 40 mg by mouth daily at 12 noon.     etanercept 25 MG injection  Commonly known as:  ENBREL  Inject 50 mg into the skin once a week.     ferrous sulfate 325 (65 FE) MG tablet  Take 1 tablet (325 mg total) by mouth 3 (three) times daily after meals.     HYDROcodone-acetaminophen 7.5-325 MG per tablet  Commonly known as:  NORCO  Take 1-2 tablets by mouth every 4 (four) hours.     ipratropium 0.03 % nasal spray  Commonly known as:  ATROVENT  Place 2 sprays into both nostrils daily.     lisinopril 20 MG tablet  Commonly known as:  PRINIVIL,ZESTRIL  Take 20 mg by mouth every morning.     metFORMIN 500 MG tablet  Commonly known as:  GLUCOPHAGE  Take 500 mg by mouth daily with breakfast.     methocarbamol 500 MG tablet  Commonly known as:  ROBAXIN  Take 1 tablet (500 mg total) by mouth every 6  (six) hours as needed for muscle spasms.     montelukast 10 MG tablet  Commonly known as:  SINGULAIR  Take 10 mg by mouth daily.     polyethylene glycol packet  Commonly known as:  MIRALAX / GLYCOLAX  Take 17 g by mouth  2 (two) times daily.     predniSONE 5 MG tablet  Commonly known as:  DELTASONE  Take 5 mg by mouth daily with breakfast.     rivaroxaban 10 MG Tabs tablet  Commonly known as:  XARELTO  Take 1 tablet (10 mg total) by mouth daily.     rosuvastatin 20 MG tablet  Commonly known as:  CRESTOR  Take 20 mg by mouth every morning.     valACYclovir 500 MG tablet  Commonly known as:  VALTREX  Take 500 mg by mouth daily.         Signed: Anastasio Auerbach. Jaz Mallick   PAC  02/21/2014, 8:30 AM

## 2014-08-14 ENCOUNTER — Other Ambulatory Visit: Payer: Self-pay | Admitting: Obstetrics and Gynecology

## 2014-08-14 DIAGNOSIS — R921 Mammographic calcification found on diagnostic imaging of breast: Secondary | ICD-10-CM

## 2014-09-16 ENCOUNTER — Ambulatory Visit
Admission: RE | Admit: 2014-09-16 | Discharge: 2014-09-16 | Disposition: A | Discharge status: Home / Self Care | Payer: BC Managed Care – PPO | Source: Ambulatory Visit | Attending: Obstetrics and Gynecology | Admitting: Obstetrics and Gynecology

## 2014-09-16 DIAGNOSIS — R921 Mammographic calcification found on diagnostic imaging of breast: Secondary | ICD-10-CM

## 2015-08-12 ENCOUNTER — Other Ambulatory Visit: Payer: Self-pay

## 2015-08-12 DIAGNOSIS — Z1231 Encounter for screening mammogram for malignant neoplasm of breast: Secondary | ICD-10-CM

## 2015-09-18 ENCOUNTER — Ambulatory Visit
Admission: RE | Admit: 2015-09-18 | Discharge: 2015-09-18 | Disposition: A | Discharge status: Home / Self Care | Payer: BLUE CROSS/BLUE SHIELD | Source: Ambulatory Visit

## 2015-09-18 DIAGNOSIS — Z1231 Encounter for screening mammogram for malignant neoplasm of breast: Secondary | ICD-10-CM

## 2016-08-27 ENCOUNTER — Encounter (HOSPITAL_COMMUNITY): Payer: Self-pay | Admitting: *Deleted

## 2016-08-27 ENCOUNTER — Ambulatory Visit (HOSPITAL_COMMUNITY)
Admission: EM | Admit: 2016-08-27 | Discharge: 2016-08-27 | Disposition: A | Discharge status: Home / Self Care | Payer: BLUE CROSS/BLUE SHIELD | Attending: Radiology | Admitting: Radiology

## 2016-08-27 DIAGNOSIS — N390 Urinary tract infection, site not specified: Secondary | ICD-10-CM

## 2016-08-27 LAB — POCT URINALYSIS DIP (DEVICE)
Glucose, UA: NEGATIVE mg/dL
Ketones, ur: NEGATIVE mg/dL
NITRITE: NEGATIVE
PH: 5.5 (ref 5.0–8.0)
PROTEIN: 100 mg/dL — AB
Specific Gravity, Urine: 1.025 (ref 1.005–1.030)
Urobilinogen, UA: 0.2 mg/dL (ref 0.0–1.0)

## 2016-08-27 MED ORDER — SULFAMETHOXAZOLE-TRIMETHOPRIM 800-160 MG PO TABS
1.0000 | ORAL_TABLET | Freq: Two times a day (BID) | ORAL | 0 refills | Status: AC
Start: 2016-08-27 — End: 2016-09-03

## 2016-08-27 NOTE — ED Provider Notes (Signed)
CSN: [id number]     Arrival date & time 08/27/16  1200 History   First MD Initiated Contact with Patient 08/27/16 1222     Chief Complaint  Patient presents with  . Medication Reaction   (Consider location/radiation/quality/duration/timing/severity/associated sxs/prior Treatment) 60 y.o. female presents with dyophaeris,  nausea  And "trash mouth" X 1 day. Patient states that she has RA and was prescribed cipro. Patient think she is having a reaction  Condition is acute in nature. Condition is made better by nothing. Condition is made worse by nothing. Patient denies any relief from treatment prior to there arrival at this facility. Patient states that she still has urinary symptoms and declines pyridium at this time.       Past Medical History:  Diagnosis Date  . Arthritis   . Diabetes mellitus   . High cholesterol   . Hypertension    Past Surgical History:  Procedure Laterality Date  . ABDOMINAL HYSTERECTOMY    . EYE SURGERY    . FOOT ARTHROPLASTY    . TOTAL KNEE ARTHROPLASTY Left 02/18/2014   Procedure: LEFT TOTAL KNEE ARTHROPLASTY;  Surgeon: Shelda Pal, MD;  Location: [facility];  Service: Orthopedics;  Laterality: Left;   Family History  Problem Relation Age of Onset  . Heart failure Mother   . Diabetes Father    Social History  Substance Use Topics  . Smoking status: Former Smoker    Quit date: 01/28/1999  . Smokeless tobacco: Not on file  . Alcohol use Yes     Comment: 2 drinks/week   OB History    No data available     Review of Systems  Constitutional: Negative for chills and fever.  HENT: Negative for ear pain and sore throat.   Eyes: Negative for pain and visual disturbance.  Respiratory: Negative for cough and shortness of breath.   Cardiovascular: Negative for chest pain and palpitations.  Gastrointestinal: Negative for abdominal pain and vomiting.  Genitourinary: Negative for dysuria and hematuria.  Musculoskeletal: Negative for arthralgias and back  pain.  Skin: Negative for color change and rash.  Neurological: Negative for seizures and syncope.  All other systems reviewed and are negative.   Allergies  Januvia [sitagliptin phosphate]; Aspirin; Cephalosporins; and Penicillins  Home Medications   Prior to Admission medications   Medication Sig Start Date End Date Taking? Authorizing Provider  acidophilus (RISAQUAD) CAPS capsule Take 1 capsule by mouth daily.    Historical Provider, MD  ALPRAZolam Prudy Feeler) 0.5 MG tablet Take 0.5 mg by mouth 2 (two) times daily as needed for anxiety.    Historical Provider, MD  docusate sodium 100 MG CAPS Take 100 mg by mouth 2 (two) times daily. 02/20/14   Lanney Gins, PA-C  esomeprazole (NEXIUM) 40 MG capsule Take 40 mg by mouth daily at 12 noon.    Historical Provider, MD  etanercept (ENBREL) 25 MG injection Inject 50 mg into the skin once a week.    Historical Provider, MD  ferrous sulfate 325 (65 FE) MG tablet Take 1 tablet (325 mg total) by mouth 3 (three) times daily after meals. 02/20/14   Lanney Gins, PA-C  HYDROcodone-acetaminophen (NORCO) 7.5-325 MG per tablet Take 1-2 tablets by mouth every 4 (four) hours. 02/20/14   Lanney Gins, PA-C  ipratropium (ATROVENT) 0.03 % nasal spray Place 2 sprays into both nostrils daily.    Historical Provider, MD  lisinopril (PRINIVIL,ZESTRIL) 20 MG tablet Take 20 mg by mouth every morning.    Historical Provider, MD  metFORMIN (GLUCOPHAGE) 500 MG tablet Take 500 mg by mouth daily with breakfast.    Historical Provider, MD  methocarbamol (ROBAXIN) 500 MG tablet Take 1 tablet (500 mg total) by mouth every 6 (six) hours as needed for muscle spasms. 02/20/14   Lanney Gins, PA-C  montelukast (SINGULAIR) 10 MG tablet Take 10 mg by mouth daily.    Historical Provider, MD  polyethylene glycol (MIRALAX / GLYCOLAX) packet Take 17 g by mouth 2 (two) times daily. 02/20/14   Lanney Gins, PA-C  predniSONE (DELTASONE) 5 MG tablet Take 5 mg by mouth daily with  breakfast.    Historical Provider, MD  rivaroxaban (XARELTO) 10 MG TABS tablet Take 1 tablet (10 mg total) by mouth daily. 02/20/14   Lanney Gins, PA-C  rosuvastatin (CRESTOR) 20 MG tablet Take 20 mg by mouth every morning.    Historical Provider, MD  sulfamethoxazole-trimethoprim (BACTRIM DS,SEPTRA DS) 800-160 MG tablet Take 1 tablet by mouth 2 (two) times daily. 08/27/16 09/03/16  Alene Mires, NP  valACYclovir (VALTREX) 500 MG tablet Take 500 mg by mouth daily.    Historical Provider, MD   Meds Ordered and Administered this Visit  Medications - No data to display  BP 150/95 (BP Location: Right Arm)   Pulse 92   Temp 98.1 F (36.7 C) (Oral)   Resp 18   SpO2 96%  No data found.   Physical Exam  Constitutional: She is oriented to person, place, and time. She appears well-developed and well-nourished.  HENT:  Head: Normocephalic and atraumatic.  Eyes: Conjunctivae are normal.  Neck: Normal range of motion.  Pulmonary/Chest: Effort normal.  Musculoskeletal: Normal range of motion.  Neurological: She is alert and oriented to person, place, and time.  Skin: Skin is warm.  Psychiatric: She has a normal mood and affect.  Nursing note and vitals reviewed.   Urgent Care Course   Clinical Course     Procedures (including critical care time)  Labs Review Labs Reviewed  POCT URINALYSIS DIP (DEVICE) - Abnormal; Notable for the following:       Result Value   Bilirubin Urine SMALL (*)    Hgb urine dipstick MODERATE (*)    Protein, ur 100 (*)    Leukocytes, UA SMALL (*)    All other components within normal limits    Imaging Review No results found.        MDM   1. Urinary tract infection without hematuria, site unspecified      Alene Mires, NP 08/27/16 1251

## 2016-08-27 NOTE — Discharge Instructions (Signed)
Continue to push fluids

## 2016-08-27 NOTE — ED Triage Notes (Signed)
Pt  Placed  On  cipro  Yesterday  For  A  uti      She  Took   1    cipro  And   Started    Having      Symptoms  Of  Sweating         Dizzy    Not  Feeling  Well       denys  Any     Rash      Or     Itching     No  Angioedema

## 2016-09-26 ENCOUNTER — Other Ambulatory Visit: Payer: Self-pay | Admitting: Family Medicine

## 2016-09-26 DIAGNOSIS — Z1231 Encounter for screening mammogram for malignant neoplasm of breast: Secondary | ICD-10-CM

## 2016-09-27 ENCOUNTER — Ambulatory Visit
Admission: RE | Admit: 2016-09-27 | Discharge: 2016-09-27 | Disposition: A | Discharge status: Home / Self Care | Payer: BLUE CROSS/BLUE SHIELD | Source: Ambulatory Visit | Attending: Family Medicine | Admitting: Family Medicine

## 2016-09-27 DIAGNOSIS — Z1231 Encounter for screening mammogram for malignant neoplasm of breast: Secondary | ICD-10-CM

## 2016-10-01 ENCOUNTER — Encounter (HOSPITAL_COMMUNITY): Payer: Self-pay | Admitting: *Deleted

## 2016-10-01 ENCOUNTER — Ambulatory Visit (HOSPITAL_COMMUNITY)
Admission: EM | Admit: 2016-10-01 | Discharge: 2016-10-01 | Disposition: A | Discharge status: Home / Self Care | Payer: BLUE CROSS/BLUE SHIELD | Attending: Emergency Medicine | Admitting: Emergency Medicine

## 2016-10-01 DIAGNOSIS — R350 Frequency of micturition: Secondary | ICD-10-CM | POA: Insufficient documentation

## 2016-10-01 DIAGNOSIS — R3129 Other microscopic hematuria: Secondary | ICD-10-CM | POA: Diagnosis not present

## 2016-10-01 LAB — POCT URINALYSIS DIP (DEVICE)
Bilirubin Urine: NEGATIVE
Glucose, UA: NEGATIVE mg/dL
Ketones, ur: NEGATIVE mg/dL
Leukocytes, UA: NEGATIVE
NITRITE: NEGATIVE
PH: 6 (ref 5.0–8.0)
PROTEIN: NEGATIVE mg/dL
Specific Gravity, Urine: 1.01 (ref 1.005–1.030)
UROBILINOGEN UA: 0.2 mg/dL (ref 0.0–1.0)

## 2016-10-01 LAB — GLUCOSE, CAPILLARY: Glucose-Capillary: 68 mg/dL (ref 65–99)

## 2016-10-01 MED ORDER — CIPROFLOXACIN HCL 500 MG PO TABS: 500.0000 mg | ORAL_TABLET | Freq: Two times a day (BID) | ORAL | 0 refills | Status: DC

## 2016-10-01 NOTE — ED Triage Notes (Addendum)
Patient reports urinary frequency and polydipsia, patient states history of diabetes but has been controlled. Symptoms x 5 days. States this am she felt dizzy. Patient has not checked CBG due to supplies being from 2016. Denies abdominal pain.

## 2016-10-01 NOTE — Discharge Instructions (Signed)
You may have either a bladder infection or less likely a kidney stone. Take Cipro twice a day for 7 days. This will cover urinary infections. I have sent your urine for culture. Your blood sugar was very good today. [facility] has some excellent physicians. [facility] and [facility] also have wonderful primary care physicians.

## 2016-10-01 NOTE — ED Provider Notes (Signed)
[facility]    CSN: [id number] Arrival date & time: 10/01/16  1205     History   Chief Complaint Chief Complaint  Patient presents with  . Urinary Frequency    HPI Brandi Fitzgerald is a 60 y.o. female.   HPI  She is a 60 year old woman here for evaluation of urinary frequency. Symptoms have been going on for about 4 days. She also reports a slight tingling sensation in the perineal area. Denies any vaginal discharge or bleeding. No abdominal pain. She has had a right side and flank pain for the last month. No nausea or vomiting. No fevers. She is a diabetic, but has not been testing her sugars. She has been trying to drink a lot of water to flush the system.  Past Medical History:  Diagnosis Date  . Arthritis   . Diabetes mellitus   . High cholesterol   . Hypertension     Patient Active Problem List   Diagnosis Date Noted  . Obese 02/19/2014  . Expected blood loss anemia 02/19/2014  . S/P left TKA 02/18/2014    Past Surgical History:  Procedure Laterality Date  . ABDOMINAL HYSTERECTOMY    . EYE SURGERY    . FOOT ARTHROPLASTY    . TOTAL KNEE ARTHROPLASTY Left 02/18/2014   Procedure: LEFT TOTAL KNEE ARTHROPLASTY;  Surgeon: Shelda Pal, MD;  Location: [facility];  Service: Orthopedics;  Laterality: Left;    OB History    No data available       Home Medications    Prior to Admission medications   Medication Sig Start Date End Date Taking? Authorizing Provider  acidophilus (RISAQUAD) CAPS capsule Take 1 capsule by mouth daily.    Historical Provider, MD  ALPRAZolam Prudy Feeler) 0.5 MG tablet Take 0.5 mg by mouth 2 (two) times daily as needed for anxiety.    Historical Provider, MD  ciprofloxacin (CIPRO) 500 MG tablet Take 1 tablet (500 mg total) by mouth 2 (two) times daily. 10/01/16   Charm Rings, MD  docusate sodium 100 MG CAPS Take 100 mg by mouth 2 (two) times daily. 02/20/14   Lanney Gins, PA-C  esomeprazole (NEXIUM) 40 MG capsule Take 40 mg by mouth  daily at 12 noon.    Historical Provider, MD  etanercept (ENBREL) 25 MG injection Inject 50 mg into the skin once a week.    Historical Provider, MD  ferrous sulfate 325 (65 FE) MG tablet Take 1 tablet (325 mg total) by mouth 3 (three) times daily after meals. 02/20/14   Lanney Gins, PA-C  HYDROcodone-acetaminophen (NORCO) 7.5-325 MG per tablet Take 1-2 tablets by mouth every 4 (four) hours. 02/20/14   Lanney Gins, PA-C  ipratropium (ATROVENT) 0.03 % nasal spray Place 2 sprays into both nostrils daily.    Historical Provider, MD  lisinopril (PRINIVIL,ZESTRIL) 20 MG tablet Take 20 mg by mouth every morning.    Historical Provider, MD  metFORMIN (GLUCOPHAGE) 500 MG tablet Take 500 mg by mouth daily with breakfast.    Historical Provider, MD  methocarbamol (ROBAXIN) 500 MG tablet Take 1 tablet (500 mg total) by mouth every 6 (six) hours as needed for muscle spasms. 02/20/14   Lanney Gins, PA-C  montelukast (SINGULAIR) 10 MG tablet Take 10 mg by mouth daily.    Historical Provider, MD  polyethylene glycol (MIRALAX / GLYCOLAX) packet Take 17 g by mouth 2 (two) times daily. 02/20/14   Lanney Gins, PA-C  predniSONE (DELTASONE) 5 MG tablet Take 5 mg  by mouth daily with breakfast.    Historical Provider, MD  rivaroxaban (XARELTO) 10 MG TABS tablet Take 1 tablet (10 mg total) by mouth daily. 02/20/14   Lanney Gins, PA-C  rosuvastatin (CRESTOR) 20 MG tablet Take 20 mg by mouth every morning.    Historical Provider, MD  valACYclovir (VALTREX) 500 MG tablet Take 500 mg by mouth daily.    Historical Provider, MD    Family History Family History  Problem Relation Age of Onset  . Heart failure Mother   . Diabetes Father     Social History Social History  Substance Use Topics  . Smoking status: Former Smoker    Quit date: 01/28/1999  . Smokeless tobacco: Never Used  . Alcohol use Yes     Comment: 2 drinks/week     Allergies   Januvia [sitagliptin phosphate]; Aspirin; Cephalosporins; and  Penicillins   Review of Systems Review of Systems As in history of present illness  Physical Exam Triage Vital Signs ED Triage Vitals  Enc Vitals Group     BP      Pulse      Resp      Temp      Temp src      SpO2      Weight      Height      Head Circumference      Peak Flow      Pain Score      Pain Loc      Pain Edu?      Excl. in GC?    No data found.   Updated Vital Signs BP 145/78 (BP Location: Left Arm)   Pulse 79   Temp 98.7 F (37.1 C) (Oral)   Resp 17   SpO2 97%   Visual Acuity Right Eye Distance:   Left Eye Distance:   Bilateral Distance:    Right Eye Near:   Left Eye Near:    Bilateral Near:     Physical Exam  Constitutional: She is oriented to person, place, and time. She appears well-developed and well-nourished. No distress.  Neck: Neck supple.  Cardiovascular: Normal rate.   Pulmonary/Chest: Effort normal.  Abdominal: Soft. She exhibits no distension. There is no tenderness. There is no rebound and no guarding.  CVA tenderness. Unable to reproduce side pain with palpation.  Neurological: She is alert and oriented to person, place, and time.     UC Treatments / Results  Labs (all labs ordered are listed, but only abnormal results are displayed) Labs Reviewed  POCT URINALYSIS DIP (DEVICE) - Abnormal; Notable for the following:       Result Value   Hgb urine dipstick TRACE (*)    All other components within normal limits  URINE CULTURE  GLUCOSE, CAPILLARY  CBG MONITORING, ED    EKG  EKG Interpretation None       Radiology No results found.  Procedures Procedures (including critical care time)  Medications Ordered in UC Medications - No data to display   Initial Impression / Assessment and Plan / UC Course  I have reviewed the triage vital signs and the nursing notes.  Pertinent labs & imaging results that were available during my care of the patient were reviewed by me and considered in my medical decision making  (see chart for details).  Clinical Course     We'll send urine for culture. With hematuria and flank pain, kidney stone is possible, but felt to be unlikely. We'll cover for  infection with Cipro for 1 week. Follow-up as needed.  Final Clinical Impressions(s) / UC Diagnoses   Final diagnoses:  Urinary frequency  Other microscopic hematuria    New Prescriptions New Prescriptions   CIPROFLOXACIN (CIPRO) 500 MG TABLET    Take 1 tablet (500 mg total) by mouth 2 (two) times daily.     Charm Rings, MD 10/01/16 [phone]

## 2016-10-02 LAB — URINE CULTURE

## 2017-01-20 ENCOUNTER — Encounter: Payer: Self-pay | Admitting: Gastroenterology

## 2017-03-14 ENCOUNTER — Ambulatory Visit (AMBULATORY_SURGERY_CENTER): Payer: Self-pay

## 2017-03-14 VITALS — Ht 67.5 in | Wt 210.4 lb

## 2017-03-14 DIAGNOSIS — Z1211 Encounter for screening for malignant neoplasm of colon: Secondary | ICD-10-CM

## 2017-03-14 MED ORDER — NA SULFATE-K SULFATE-MG SULF 17.5-3.13-1.6 GM/177ML PO SOLN
ORAL | 0 refills | Status: DC
Start: 2017-03-14 — End: 2017-03-28

## 2017-03-14 NOTE — Progress Notes (Signed)
Per pt, no allergies to soy or egg products.Pt not taking any weight loss meds or using  O2 at home.   Pt refused Emmi video. 

## 2017-03-20 ENCOUNTER — Encounter: Payer: Self-pay | Admitting: Gastroenterology

## 2017-03-28 ENCOUNTER — Ambulatory Visit (AMBULATORY_SURGERY_CENTER): Payer: BLUE CROSS/BLUE SHIELD | Admitting: Gastroenterology

## 2017-03-28 ENCOUNTER — Encounter: Payer: Self-pay | Admitting: Gastroenterology

## 2017-03-28 VITALS — BP 104/58 | HR 67 | Temp 97.3°F | Resp 16 | Ht 67.5 in | Wt 210.0 lb

## 2017-03-28 DIAGNOSIS — Z1211 Encounter for screening for malignant neoplasm of colon: Secondary | ICD-10-CM | POA: Diagnosis not present

## 2017-03-28 DIAGNOSIS — Z1212 Encounter for screening for malignant neoplasm of rectum: Secondary | ICD-10-CM | POA: Diagnosis not present

## 2017-03-28 MED ORDER — SODIUM CHLORIDE 0.9 % IV SOLN: 500.0000 mL | INTRAVENOUS | Status: DC

## 2017-03-28 NOTE — Progress Notes (Signed)
Pt's states no medical or surgical changes since previsit or office visit. 

## 2017-03-28 NOTE — Patient Instructions (Signed)
  Handout given for Diverticulosis  YOU HAD AN ENDOSCOPIC PROCEDURE TODAY AT THE [facility]:   Refer to the procedure report that was given to you for any specific questions about what was found during the examination.  If the procedure report does not answer your questions, please call your gastroenterologist to clarify.  If you requested that your care partner not be given the details of your procedure findings, then the procedure report has been included in a sealed envelope for you to review at your convenience later.  YOU SHOULD EXPECT: Some feelings of bloating in the abdomen. Passage of more gas than usual.  Walking can help get rid of the air that was put into your GI tract during the procedure and reduce the bloating. If you had a lower endoscopy (such as a colonoscopy or flexible sigmoidoscopy) you may notice spotting of blood in your stool or on the toilet paper. If you underwent a bowel prep for your procedure, you may not have a normal bowel movement for a few days.  Please Note:  You might notice some irritation and congestion in your nose or some drainage.  This is from the oxygen used during your procedure.  There is no need for concern and it should clear up in a day or so.  SYMPTOMS TO REPORT IMMEDIATELY:   Following lower endoscopy (colonoscopy or flexible sigmoidoscopy):  Excessive amounts of blood in the stool  Significant tenderness or worsening of abdominal pains  Swelling of the abdomen that is new, acute  Fever of 100F or higher  For urgent or emergent issues, a gastroenterologist can be reached at any hour by calling [phone].   DIET:  We do recommend a small meal at first, but then you may proceed to your regular diet.  Drink plenty of fluids but you should avoid alcoholic beverages for 24 hours.  ACTIVITY:  You should plan to take it easy for the rest of today and you should NOT DRIVE or use heavy machinery until tomorrow (because of the sedation  medicines used during the test).    FOLLOW UP: Our staff will call the number listed on your records the next business day following your procedure to check on you and address any questions or concerns that you may have regarding the information given to you following your procedure. If we do not reach you, we will leave a message.  However, if you are feeling well and you are not experiencing any problems, there is no need to return our call.  We will assume that you have returned to your regular daily activities without incident.  If any biopsies were taken you will be contacted by phone or by letter within the next 1-3 weeks.  Please call us at [phone] if you have not heard about the biopsies in 3 weeks.    SIGNATURES/CONFIDENTIALITY: You and/or your care partner have signed paperwork which will be entered into your electronic medical record.  These signatures attest to the fact that that the information above on your After Visit Summary has been reviewed and is understood.  Full responsibility of the confidentiality of this discharge information lies with you and/or your care-partner.

## 2017-03-28 NOTE — Progress Notes (Signed)
Report to PACU, RN, vss, BBS= Clear.  

## 2017-03-28 NOTE — Op Note (Signed)
[facility] Patient Name: Brandi Fitzgerald Procedure Date: 03/28/2017 10:51 AM MRN: [id number] Endoscopist: Sherilyn Cooter L. Myrtie Neither , MD Age: 61 Referring MD:  Date of Birth: Sep 09, 1956 Gender: Female Account #: [id number] Procedure:                Colonoscopy Indications:              Screening for colorectal malignant neoplasm                            (patient reports no polyps on colonoscopy 10 years                            prior) Medicines:                Monitored Anesthesia Care Procedure:                Pre-Anesthesia Assessment:                           - Prior to the procedure, a History and Physical                            was performed, and patient medications and                            allergies were reviewed. The patient's tolerance of                            previous anesthesia was also reviewed. The risks                            and benefits of the procedure and the sedation                            options and risks were discussed with the patient.                            All questions were answered, and informed consent                            was obtained. Prior Anticoagulants: The patient has                            taken no previous anticoagulant or antiplatelet                            agents. ASA Grade Assessment: II - A patient with                            mild systemic disease. After reviewing the risks                            and benefits, the patient was deemed in  satisfactory condition to undergo the procedure.                           After obtaining informed consent, the colonoscope                            was passed under direct vision. Throughout the                            procedure, the patient's blood pressure, pulse, and                            oxygen saturations were monitored continuously. The                            Colonoscope was introduced through the anus and                      advanced to the the cecum, identified by                            appendiceal orifice and ileocecal valve. The                            colonoscopy was performed without difficulty. The                            patient tolerated the procedure well. The quality                            of the bowel preparation was excellent. The                            ileocecal valve, appendiceal orifice, and rectum                            were photographed. The quality of the bowel                            preparation was evaluated using the BBPS [facility]                            Bowel Preparation Scale) with scores of: Right                            Colon = 3, Transverse Colon = 3 and Left Colon = 3                            (entire mucosa seen well with no residual staining,                            small fragments of stool or opaque liquid). The  total BBPS score equals 9. The bowel preparation                            used was SUPREP. Scope In: 11:09:58 AM Scope Out: 11:24:37 AM Scope Withdrawal Time: 0 hours 9 minutes 10 seconds  Total Procedure Duration: 0 hours 14 minutes 39 seconds  Findings:                 The perianal and digital rectal examinations were                            normal.                           Multiple medium-mouthed diverticula were found in                            the left colon and right colon.                           The exam was otherwise without abnormality on                            direct and retroflexion views. Complications:            No immediate complications. Estimated Blood Loss:     Estimated blood loss: none. Impression:               - Diverticulosis in the left colon and in the right                            colon.                           - The examination was otherwise normal on direct                            and retroflexion views.                           - No  specimens collected. Recommendation:           - Patient has a contact number available for                            emergencies. The signs and symptoms of potential                            delayed complications were discussed with the                            patient. Return to normal activities tomorrow.                            Written discharge instructions were provided to the  patient.                           - Resume previous diet.                           - Continue present medications.                           - Repeat colonoscopy in 10 years for screening                            purposes. Jenavive Lamboy L. Myrtie Neither, MD 03/28/2017 11:29:26 AM This report has been signed electronically.

## 2017-03-29 ENCOUNTER — Telehealth: Payer: Self-pay | Admitting: *Deleted

## 2017-03-29 NOTE — Telephone Encounter (Signed)
  Follow up Call-  Call back number 03/28/2017  Post procedure Call Back phone  # [phone]  Permission to leave phone message Yes  Some recent data might be hidden     Patient questions:  Do you have a fever, pain , or abdominal swelling? No. Pain Score  0 *  Have you tolerated food without any problems? Yes.    Have you been able to return to your normal activities? Yes.    Do you have any questions about your discharge instructions: Diet   No. Medications  No. Follow up visit  No.  Do you have questions or concerns about your Care? No.  Actions: * If pain score is 4 or above: No action needed, pain <4.

## 2017-03-29 NOTE — Telephone Encounter (Signed)
  Follow up Call-  Call back number 03/28/2017  Post procedure Call Back phone  # [phone]  Permission to leave phone message Yes  Some recent data might be hidden     Patient questions:  Do you have a fever, pain , or abdominal swelling? No. Pain Score  0 *  Have you tolerated food without any problems? Yes.    Have you been able to return to your normal activities? Yes.    Do you have any questions about your discharge instructions: Diet   No. Medications  No. Follow up visit  No.  Do you have questions or concerns about your Care? No.  Actions: * If pain score is 4 or above: No action needed, pain <4.  

## 2017-05-27 ENCOUNTER — Encounter (HOSPITAL_COMMUNITY): Payer: Self-pay | Admitting: Nurse Practitioner

## 2017-05-27 ENCOUNTER — Emergency Department (HOSPITAL_COMMUNITY)
Admission: EM | Admit: 2017-05-27 | Discharge: 2017-05-27 | Disposition: A | Discharge status: Home / Self Care | Payer: BLUE CROSS/BLUE SHIELD | Attending: Emergency Medicine | Admitting: Emergency Medicine

## 2017-05-27 DIAGNOSIS — R1011 Right upper quadrant pain: Secondary | ICD-10-CM | POA: Diagnosis present

## 2017-05-27 DIAGNOSIS — Z79899 Other long term (current) drug therapy: Secondary | ICD-10-CM | POA: Insufficient documentation

## 2017-05-27 DIAGNOSIS — Z7984 Long term (current) use of oral hypoglycemic drugs: Secondary | ICD-10-CM | POA: Insufficient documentation

## 2017-05-27 DIAGNOSIS — Z87891 Personal history of nicotine dependence: Secondary | ICD-10-CM | POA: Insufficient documentation

## 2017-05-27 DIAGNOSIS — E119 Type 2 diabetes mellitus without complications: Secondary | ICD-10-CM | POA: Diagnosis not present

## 2017-05-27 DIAGNOSIS — I10 Essential (primary) hypertension: Secondary | ICD-10-CM | POA: Insufficient documentation

## 2017-05-27 LAB — CBC
HCT: 33.7 % — ABNORMAL LOW (ref 36.0–46.0)
Hemoglobin: 11.4 g/dL — ABNORMAL LOW (ref 12.0–15.0)
MCH: 30.6 pg (ref 26.0–34.0)
MCHC: 33.8 g/dL (ref 30.0–36.0)
MCV: 90.3 fL (ref 78.0–100.0)
PLATELETS: 319 10*3/uL (ref 150–400)
RBC: 3.73 MIL/uL — ABNORMAL LOW (ref 3.87–5.11)
RDW: 15.2 % (ref 11.5–15.5)
WBC: 7.9 10*3/uL (ref 4.0–10.5)

## 2017-05-27 LAB — URINALYSIS, ROUTINE W REFLEX MICROSCOPIC
Bacteria, UA: NONE SEEN
Bilirubin Urine: NEGATIVE
GLUCOSE, UA: NEGATIVE mg/dL
KETONES UR: NEGATIVE mg/dL
Nitrite: NEGATIVE
PH: 6 (ref 5.0–8.0)
PROTEIN: NEGATIVE mg/dL
SQUAMOUS EPITHELIAL / LPF: NONE SEEN
Specific Gravity, Urine: 1.02 (ref 1.005–1.030)

## 2017-05-27 LAB — COMPREHENSIVE METABOLIC PANEL
ALBUMIN: 3.9 g/dL (ref 3.5–5.0)
ALK PHOS: 74 U/L (ref 38–126)
ALT: 16 U/L (ref 14–54)
AST: 20 U/L (ref 15–41)
Anion gap: 9 (ref 5–15)
BUN: 13 mg/dL (ref 6–20)
CALCIUM: 9.1 mg/dL (ref 8.9–10.3)
CHLORIDE: 105 mmol/L (ref 101–111)
CO2: 28 mmol/L (ref 22–32)
CREATININE: 1.03 mg/dL — AB (ref 0.44–1.00)
GFR calc Af Amer: >60 mL/min (ref >60)
GFR calc non Af Amer: 57 mL/min — ABNORMAL LOW (ref >60)
Glucose, Bld: 104 mg/dL — ABNORMAL HIGH (ref 65–99)
Potassium: 4 mmol/L (ref 3.5–5.1)
SODIUM: 142 mmol/L (ref 135–145)
Total Bilirubin: 0.5 mg/dL (ref 0.3–1.2)
Total Protein: 6.9 g/dL (ref 6.5–8.1)

## 2017-05-27 LAB — LIPASE, BLOOD: LIPASE: 28 U/L (ref 11–51)

## 2017-05-27 NOTE — Discharge Instructions (Signed)
Your blood work today was reassuring. Given that your pain has been present for the last 6 months and a history of normal CT scan since onset of your pain, we believe you are stable for follow-up with a gastroenterologist. Call the office of [facility] in the morning to schedule an appointment. You may return to the emergency department, as needed, for new or concerning symptoms.

## 2017-05-27 NOTE — ED Triage Notes (Signed)
Pt is c/o RUQ pain that radiates to the right flank. Requesting to be evaluated for gall stones.

## 2017-05-27 NOTE — ED Notes (Signed)
Patient a/o x4. States she is just ready to go home and does not want her vs taken again. Dc instructions reviewed with pt. Pt ambulatory upon discharge.

## 2017-06-01 NOTE — ED Provider Notes (Signed)
[facility]-EMERGENCY DEPT Provider Note   CSN: [id number] Arrival date & time: 05/27/17  1932     History   Chief Complaint Chief Complaint  Patient presents with  . Flank Pain  . Abdominal Pain    HPI Janayla Marik is a 61 y.o. female.  61 year old female with a history of anxiety, diabetes, dyslipidemia, hypertension presents to the emergency department for abdominal pain. She states that she has had similar abdominal pain over the past 6 months. Pain is intermittent and sharp. She notes worsening discomfort today prompting her ED evaluation. She had a reassuring colonoscopy by [facility] gastroenterology in June. Pain also previously evaluated with a CT scan in February. Patient denies any known modifying factors of her pain. She has not had any associated fever, nausea, vomiting, diarrhea, melena or hematochezia, urinary symptoms. Abdominal surgical history significant for hysterectomy. No medications taken prior to arrival for symptoms.   The history is provided by the patient. No language interpreter was used.  Flank Pain  Associated symptoms include abdominal pain.  Abdominal Pain      Past Medical History:  Diagnosis Date  . Anxiety   . Arthritis   . Diabetes mellitus   . High cholesterol   . Hypertension     Patient Active Problem List   Diagnosis Date Noted  . Obese 02/19/2014  . Expected blood loss anemia 02/19/2014  . S/P left TKA 02/18/2014    Past Surgical History:  Procedure Laterality Date  . ABDOMINAL HYSTERECTOMY    . BUNIONECTOMY     left foot- twice  . EYE SURGERY     had sand in eye  . FOOT ARTHROPLASTY     Bil  . 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 WRIST ARTHROSCOPY     right    OB History    No data available       Home Medications    Prior to Admission medications   Medication Sig Start Date End  Date Taking? Authorizing Provider  acidophilus (RISAQUAD) CAPS capsule Take 1 capsule by mouth daily.    [provider]  ALPRAZolam Prudy Feeler) 0.5 MG tablet Take 0.5 mg by mouth 2 (two) times daily as needed for anxiety.    [provider]  ciprofloxacin (CIPRO) 500 MG tablet Take 1 tablet (500 mg total) by mouth 2 (two) times daily. Patient not taking: Reported on 03/14/2017 10/01/16   Charm Rings, MD  docusate sodium 100 MG CAPS Take 100 mg by mouth 2 (two) times daily. Patient not taking: Reported on 03/14/2017 02/20/14   Lanney Gins, PA-C  esomeprazole (NEXIUM) 40 MG capsule Take 40 mg by mouth daily at 12 noon.    [provider]  etanercept (ENBREL) 25 MG injection Inject 50 mg into the skin once a week.    [provider]  ferrous sulfate 325 (65 FE) MG tablet Take 1 tablet (325 mg total) by mouth 3 (three) times daily after meals. Patient not taking: Reported on 03/14/2017 02/20/14   Lanney Gins, PA-C  HYDROcodone-acetaminophen (NORCO) 7.5-325 MG per tablet Take 1-2 tablets by mouth every 4 (four) hours. Patient taking differently: Take 1-2 tablets by mouth 4 (four) times daily as needed.  02/20/14   Lanney Gins, PA-C  ipratropium (ATROVENT) 0.03 % nasal spray Place 2 sprays into both nostrils daily.    [provider]  lisinopril (PRINIVIL,ZESTRIL) 20 MG tablet Take 20 mg by mouth every morning.    [provider]  metFORMIN (GLUCOPHAGE) 500 MG tablet Take 500 mg by mouth daily with breakfast.    [provider]  methocarbamol (ROBAXIN) 500 MG tablet Take 1 tablet (500 mg total) by mouth every 6 (six) hours as needed for muscle spasms. Patient not taking: Reported on 03/14/2017 02/20/14   Lanney Gins, PA-C  montelukast (SINGULAIR) 10 MG tablet Take 10 mg by mouth daily.    [provider]  polyethylene glycol (MIRALAX / GLYCOLAX) packet Take 17 g by mouth 2 (two) times daily. Patient not taking: Reported on  03/14/2017 02/20/14   Lanney Gins, PA-C  predniSONE (DELTASONE) 5 MG tablet Take 5 mg by mouth daily with breakfast.    [provider]  rivaroxaban (XARELTO) 10 MG TABS tablet Take 1 tablet (10 mg total) by mouth daily. Patient not taking: Reported on 03/14/2017 02/20/14   Lanney Gins, PA-C  rosuvastatin (CRESTOR) 20 MG tablet Take 20 mg by mouth. Take 1 and half pills daily    [provider]  valACYclovir (VALTREX) 500 MG tablet Take 500 mg by mouth daily.    [provider]  VITAMIN D, CHOLECALCIFEROL, PO Take by mouth. Vit d 1.25 mg weekly    [provider]    Family History Family History  Problem Relation Age of Onset  . Heart failure Mother   . Heart disease Father   . Drug abuse Brother   . Heart disease Brother     Social History Social History  Substance Use Topics  . Smoking status: Former Smoker    Quit date: 01/28/1999  . Smokeless tobacco: Never Used  . Alcohol use 1.2 oz/week    2 Standard drinks or equivalent per week     Comment: 2 drinks/week     Allergies   Januvia [sitagliptin phosphate]; Aspirin; Cephalosporins; and Penicillins   Review of Systems Review of Systems  Gastrointestinal: Positive for abdominal pain.  Genitourinary: Positive for flank pain.   Ten systems reviewed and are negative for acute change, except as noted in the HPI.    Physical Exam Updated Vital Signs BP 132/90 (BP Location: Left Arm)   Pulse 75   Temp 98.9 F (37.2 C) (Oral)   Resp 20   SpO2 99%   Physical Exam  Constitutional: She is oriented to person, place, and time. She appears well-developed and well-nourished. No distress.  Nontoxic appearing and in no acute distress  HENT:  Head: Normocephalic and atraumatic.  Eyes: Conjunctivae and EOM are normal. No scleral icterus.  Neck: Normal range of motion.  Cardiovascular: Normal rate, regular rhythm and intact distal pulses.   Pulmonary/Chest: Effort normal. No respiratory  distress. She has no wheezes.  Respirations even and unlabored  Abdominal: Soft.  Soft, mildly obese abdomen. There is minimal tenderness in the right upper quadrant. Negative Murphy sign. No peritoneal signs, masses, guarding.  Musculoskeletal: Normal range of motion.  Neurological: She is alert and oriented to person, place, and time. She exhibits normal muscle tone. Coordination normal.  GCS 15. Patient ambulatory with steady gait.  Skin: Skin is warm and dry. No rash noted. She is not diaphoretic. No erythema. No pallor.  Psychiatric: She has a normal mood and affect. Her behavior is normal.  Nursing note and vitals reviewed.    ED Treatments / Results  Labs (all labs ordered are listed, but only abnormal results are displayed) Labs Reviewed  COMPREHENSIVE METABOLIC PANEL - Abnormal; Notable for the following:       Result Value   Glucose, Bld 104 (*)    Creatinine, Ser 1.03 (*)    GFR calc non Af Amer 57 (*)    All other components within normal limits  CBC - Abnormal; Notable for the following:    RBC 3.73 (*)    Hemoglobin 11.4 (*)    HCT 33.7 (*)    All other components within normal limits  URINALYSIS, ROUTINE W REFLEX MICROSCOPIC - Abnormal; Notable for the following:    Hgb urine dipstick SMALL (*)    Leukocytes, UA TRACE (*)    All other components within normal limits  LIPASE, BLOOD    EKG  EKG Interpretation None       Radiology No results found.  Procedures Procedures (including critical care time)  Medications Ordered in ED Medications - No data to display   Initial Impression / Assessment and Plan / ED Course  I have reviewed the triage vital signs and the nursing notes.  Pertinent labs & imaging results that were available during my care of the patient were reviewed by me and considered in my medical decision making (see chart for details).     61 year old female presents to the emergency department for evaluation of 6 months of right upper  quadrant abdominal pain. She notes slight worsening of her pain today which prompted her ED evaluation. Patient is nontoxic appearing and in no acute distress. She is also afebrile with stable vital signs. Physical exam without signs of acute surgical abdomen. Laboratory workup reviewed which is reassuring. Patient has no leukocytosis. Anemia consistent with baseline. Liver and kidney function preserved. No electrolyte derangements.  Given reassuring evaluation and chronicity of symptoms, low suspicion for emergent process. Patient also with a previous CT scan to evaluate this pain. CT imaging negative and was reviewed in Care Everywhere. Patient was noted to also have a reassuring colonoscopy in June. I have recommended that the patient follow-up further with gastroenterology. She has been seen by [facility] in the past. Supportive management indicated with return if symptoms worsen. Patient discharged in stable condition with no unaddressed concerns.   Final Clinical Impressions(s) / ED Diagnoses   Final diagnoses:  Right upper quadrant abdominal pain    New Prescriptions Discharge Medication List as of 05/27/2017 11:03 PM       Antony Madura, PA-C 06/01/17 7353    Linwood Dibbles, MD 06/09/17 [phone]

## 2017-07-07 ENCOUNTER — Encounter (INDEPENDENT_AMBULATORY_CARE_PROVIDER_SITE_OTHER): Payer: Self-pay

## 2017-07-07 ENCOUNTER — Ambulatory Visit (INDEPENDENT_AMBULATORY_CARE_PROVIDER_SITE_OTHER): Payer: BLUE CROSS/BLUE SHIELD | Admitting: Gastroenterology

## 2017-07-07 ENCOUNTER — Encounter: Payer: Self-pay | Admitting: Gastroenterology

## 2017-07-07 VITALS — BP 106/74 | HR 84 | Ht 66.0 in | Wt 212.0 lb

## 2017-07-07 DIAGNOSIS — R1011 Right upper quadrant pain: Secondary | ICD-10-CM

## 2017-07-07 DIAGNOSIS — F329 Major depressive disorder, single episode, unspecified: Secondary | ICD-10-CM | POA: Diagnosis not present

## 2017-07-07 NOTE — Progress Notes (Signed)
[facility] Progress Note  Chief Complaint: Right upper Quadrant pain  Subjective  History:  This is a 61 year old woman known to me from a screening colonoscopy in June of this year, at which time just diverticulosis was found. She reports that for the last 2-3 months she has had intermittent right upper quadrant pain that is sometimes very brief, other times will be dull and seemed to last all day. It comes and goes, nonradiating and no clear triggers or relieving factors. Altogether, it is somewhat difficult to characterize. She cannot recall a initial trigger, and in retrospect believes that it was happening earlier this year as well. At that time, she appears to have come to a local emergency department outside the [facility] system, and the note indicates that a CT scan and labs were normal. Yesterday she was feeling dizzy and I continues today. She describes that as a "swimming feeling and a fogginess". Sounds as if she has had a recent dose increase of lisinopril. She is also having hot flashes over the last several weeks, which is unusual for her since she is postmenopausal.   ROS: Cardiovascular:  no chest pain Respiratory: no dyspnea  She asked if any of these symptoms could be from "nerves". During the course of the visit, she broke down in tears and began telling me how set him only she has been since the loss of her husband year and a half ago. She feels isolated living in [place] with no friends or family there. It does not sound as if she has confided in her primary care physician about this, and has not sought any counseling or other help. She felt as if she did not know where to turn.  The patient's Past Medical, Family and Social History were reviewed and are on file in the EMR.  Objective:  Med list reviewed  Current Outpatient Prescriptions:  .  ALPRAZolam (XANAX) 0.5 MG tablet, Take 0.5 mg by mouth 2 (two) times daily as needed for anxiety., Disp: , Rfl:  .   DOCOSAHEXAENOIC ACID PO, Take 1 g by mouth daily., Disp: , Rfl:  .  esomeprazole (NEXIUM) 40 MG capsule, Take 40 mg by mouth daily at 12 noon., Disp: , Rfl:  .  ferrous sulfate 325 (65 FE) MG tablet, Take 1 tablet (325 mg total) by mouth 3 (three) times daily after meals., Disp: , Rfl: 3 .  HYDROcodone-acetaminophen (NORCO) 7.5-325 MG per tablet, Take 1-2 tablets by mouth every 4 (four) hours. (Patient taking differently: Take 1-2 tablets by mouth 4 (four) times daily as needed. ), Disp: 100 tablet, Rfl: 0 .  ipratropium (ATROVENT) 0.03 % nasal spray, Place 2 sprays into both nostrils daily., Disp: , Rfl:  .  lisinopril (PRINIVIL,ZESTRIL) 20 MG tablet, Take 20 mg by mouth every morning., Disp: , Rfl:  .  metFORMIN (GLUCOPHAGE) 500 MG tablet, Take 500 mg by mouth daily with breakfast., Disp: , Rfl:  .  ORENCIA 125 MG/ML SOSY, Inject 125 mg into the muscle once a week., Disp: , Rfl:  .  predniSONE (DELTASONE) 5 MG tablet, Take 5 mg by mouth daily with breakfast., Disp: , Rfl:  .  rosuvastatin (CRESTOR) 20 MG tablet, Take 20 mg by mouth. Take 1 and half pills daily, Disp: , Rfl:  .  valACYclovir (VALTREX) 500 MG tablet, Take 500 mg by mouth daily., Disp: , Rfl:  .  VITAMIN D, CHOLECALCIFEROL, PO, Take by mouth. Vit d 1.25 mg weekly, Disp: , Rfl:  .  YUVAFEM 10 MCG TABS vaginal tablet, Take 2 tablets by mouth once a week., Disp: , Rfl:  .  zolpidem (AMBIEN) 10 MG tablet, Take 1 tablet by mouth as needed., Disp: , Rfl:   Current Facility-Administered Medications:  .  0.9 %  sodium chloride infusion, 500 mL, Intravenous, Continuous, Danis, Starr Lake III, MD   Vital signs in last 24 hrs: Vitals:   07/07/17 0941  BP: 106/74  Pulse: 84    Physical Exam    HEENT: sclera anicteric, oral mucosa moist without lesions  Neck: supple, no thyromegaly, JVD or lymphadenopathy  Cardiac: RRR without murmurs, S1S2 heard, no peripheral edema  Pulm: clear to auscultation bilaterally, normal RR and effort  noted  Abdomen: soft, No tenderness, with active bowel sounds. No guarding or palpable hepatosplenomegaly.  Skin; warm and dry, no jaundice or rash  Recent Labs:  CT abdomen and pelvis, CBC, CMP reportedly normal during the ED visit as noted above  @ASSESSMENTPLANBEGIN @ Assessment: Encounter Diagnoses  Name Primary?  . RUQ pain Yes  . Reactive depression     I think this nonspecific pain is benign in nature and I done my best to reassure her.  Her more worrisome issue is severe depression since the loss of her husband year and a half ago.  Plan: I have no further tests planned or medicines prescribed for this right upper quadrant pain We have made a referral to [facility]   Total time 30 minutes, over half spent in record review ,counseling and coordination of care.   III

## 2017-07-07 NOTE — Patient Instructions (Signed)
If you are age 63 or older, your body mass index should be between 23-30. Your Body mass index is 34.22 kg/m. If this is out of the aforementioned range listed, please consider follow up with your Primary Care Provider.  If you are age 13 or younger, your body mass index should be between 19-25. Your Body mass index is 34.22 kg/m. If this is out of the aformentioned range listed, please consider follow up with your Primary Care Provider.   We have sent your records to [facility]. They will call to schedule you a visit.  Thank you for choosing  GI  Dr Amada Jupiter III

## 2017-07-13 ENCOUNTER — Telehealth: Payer: Self-pay

## 2017-07-13 NOTE — Telephone Encounter (Signed)
Records for NP referral appointment faxed to [facility] office at [phone]. Called to today to follow up. No appointment scheduled yet. The office will fax appointment sheet when scheduled.

## 2017-07-27 NOTE — Telephone Encounter (Signed)
Left a message to have pt return call. RE Referral to behavior health.

## 2017-07-28 NOTE — Telephone Encounter (Signed)
Pt returned call, info given for her to call and schedule an appointment.

## 2017-08-18 ENCOUNTER — Other Ambulatory Visit: Payer: Self-pay | Admitting: Family Medicine

## 2017-08-18 DIAGNOSIS — Z1231 Encounter for screening mammogram for malignant neoplasm of breast: Secondary | ICD-10-CM

## 2017-08-18 NOTE — Telephone Encounter (Signed)
Spoke to [employer]. Pt has still not scheduled. Receptionist will call and follow up on the referral today. She will call the office back with a status report.

## 2017-08-18 NOTE — Telephone Encounter (Signed)
[facility] called to state they called patient on October 4, and haven't heard back since then.

## 2017-08-21 NOTE — Telephone Encounter (Signed)
Thanks for the info. I suspect she will call them if she eventually chooses to do so. No need for further action on our end.

## 2017-08-21 NOTE — Telephone Encounter (Signed)
Looks like Brandi Fitzgerald has declined to schedule a visit with Behavior heath.

## 2017-09-28 ENCOUNTER — Ambulatory Visit
Admission: RE | Admit: 2017-09-28 | Discharge: 2017-09-28 | Disposition: A | Discharge status: Home / Self Care | Payer: BLUE CROSS/BLUE SHIELD | Source: Ambulatory Visit | Attending: Family Medicine | Admitting: Family Medicine

## 2017-09-28 ENCOUNTER — Ambulatory Visit: Payer: BLUE CROSS/BLUE SHIELD

## 2017-09-28 DIAGNOSIS — Z1231 Encounter for screening mammogram for malignant neoplasm of breast: Secondary | ICD-10-CM

## 2017-12-04 LAB — BASIC METABOLIC PANEL
Creatinine: 1.1 (ref <=1.1)
Glucose: 70
Potassium: 4.9 (ref 3.4–5.3)
Sodium: 143 (ref 137–147)

## 2017-12-04 LAB — CBC AND DIFFERENTIAL
HCT: 36 (ref 36–46)
HEMOGLOBIN: 11.7 — AB (ref 12.0–16.0)
Hemoglobin: 11.9 — AB (ref 12.0–16.0)
Platelets: 347 (ref 150–399)
WBC: 11.7

## 2017-12-04 LAB — HEPATIC FUNCTION PANEL
ALK PHOS: 83 (ref 25–125)
ALT: 13 (ref 7–35)
AST: 15 (ref 13–35)

## 2018-02-23 LAB — CBC AND DIFFERENTIAL
HEMATOCRIT: 38 (ref 36–46)
Hemoglobin: 12.4 (ref 12.0–16.0)
PLATELETS: 322 (ref 150–399)
WBC: 11.2

## 2018-02-23 LAB — BASIC METABOLIC PANEL
CREATININE: 1.1 (ref <=1.1)
Glucose: 127
Potassium: 4.1 (ref 3.4–5.3)
Sodium: 142 (ref 137–147)

## 2018-02-23 LAB — HEPATIC FUNCTION PANEL
ALT: 12 (ref 7–35)
AST: 14 (ref 13–35)
Alkaline Phosphatase: 76 (ref 25–125)

## 2018-05-14 ENCOUNTER — Ambulatory Visit (INDEPENDENT_AMBULATORY_CARE_PROVIDER_SITE_OTHER): Payer: BLUE CROSS/BLUE SHIELD | Admitting: Family Medicine

## 2018-05-14 ENCOUNTER — Encounter: Payer: Self-pay | Admitting: Family Medicine

## 2018-05-14 VITALS — BP 130/78 | HR 69 | Temp 97.9°F | Ht 67.5 in | Wt 206.8 lb

## 2018-05-14 DIAGNOSIS — I10 Essential (primary) hypertension: Secondary | ICD-10-CM | POA: Insufficient documentation

## 2018-05-14 DIAGNOSIS — Z114 Encounter for screening for human immunodeficiency virus [HIV]: Secondary | ICD-10-CM

## 2018-05-14 DIAGNOSIS — Z23 Encounter for immunization: Secondary | ICD-10-CM | POA: Diagnosis not present

## 2018-05-14 DIAGNOSIS — Z1159 Encounter for screening for other viral diseases: Secondary | ICD-10-CM

## 2018-05-14 DIAGNOSIS — E782 Mixed hyperlipidemia: Secondary | ICD-10-CM

## 2018-05-14 DIAGNOSIS — E119 Type 2 diabetes mellitus without complications: Secondary | ICD-10-CM | POA: Diagnosis not present

## 2018-05-14 DIAGNOSIS — Z0000 Encounter for general adult medical examination without abnormal findings: Secondary | ICD-10-CM | POA: Diagnosis not present

## 2018-05-14 DIAGNOSIS — F419 Anxiety disorder, unspecified: Secondary | ICD-10-CM | POA: Diagnosis not present

## 2018-05-14 DIAGNOSIS — A6000 Herpesviral infection of urogenital system, unspecified: Secondary | ICD-10-CM | POA: Insufficient documentation

## 2018-05-14 DIAGNOSIS — E785 Hyperlipidemia, unspecified: Secondary | ICD-10-CM | POA: Insufficient documentation

## 2018-05-14 DIAGNOSIS — G4700 Insomnia, unspecified: Secondary | ICD-10-CM | POA: Insufficient documentation

## 2018-05-14 DIAGNOSIS — Z7952 Long term (current) use of systemic steroids: Secondary | ICD-10-CM

## 2018-05-14 DIAGNOSIS — A6004 Herpesviral vulvovaginitis: Secondary | ICD-10-CM

## 2018-05-14 DIAGNOSIS — R8290 Unspecified abnormal findings in urine: Secondary | ICD-10-CM | POA: Diagnosis not present

## 2018-05-14 DIAGNOSIS — M069 Rheumatoid arthritis, unspecified: Secondary | ICD-10-CM | POA: Insufficient documentation

## 2018-05-14 LAB — COMPREHENSIVE METABOLIC PANEL
ALBUMIN: 4.4 g/dL (ref 3.5–5.2)
ALT: 12 U/L (ref 0–35)
AST: 14 U/L (ref 0–37)
Alkaline Phosphatase: 75 U/L (ref 39–117)
BILIRUBIN TOTAL: 0.7 mg/dL (ref 0.2–1.2)
BUN: 14 mg/dL (ref 6–23)
CO2: 29 mEq/L (ref 19–32)
CREATININE: 0.94 mg/dL (ref 0.40–1.20)
Calcium: 9.8 mg/dL (ref 8.4–10.5)
Chloride: 103 mEq/L (ref 96–112)
GFR: 77.11 mL/min (ref >60.00)
GLUCOSE: 98 mg/dL (ref 70–99)
POTASSIUM: 3.8 meq/L (ref 3.5–5.1)
Sodium: 141 mEq/L (ref 135–145)
TOTAL PROTEIN: 7.5 g/dL (ref 6.0–8.3)

## 2018-05-14 LAB — MICROALBUMIN / CREATININE URINE RATIO
Creatinine,U: 139.5 mg/dL
Microalb Creat Ratio: 0.5 mg/g (ref 0.0–30.0)
Microalb, Ur: <0.7 mg/dL (ref 0.0–1.9)

## 2018-05-14 LAB — POCT URINALYSIS DIPSTICK
Bilirubin, UA: NEGATIVE
GLUCOSE UA: NEGATIVE
Ketones, UA: NEGATIVE
LEUKOCYTES UA: NEGATIVE
Nitrite, UA: NEGATIVE
Protein, UA: NEGATIVE
Spec Grav, UA: 1.025 (ref 1.010–1.025)
Urobilinogen, UA: 0.2 E.U./dL
pH, UA: 5.5 (ref 5.0–8.0)

## 2018-05-14 LAB — CBC WITH DIFFERENTIAL/PLATELET
BASOS ABS: 0.1 10*3/uL (ref 0.0–0.1)
Basophils Relative: 0.5 % (ref 0.0–3.0)
EOS PCT: 0.5 % (ref 0.0–5.0)
Eosinophils Absolute: 0 10*3/uL (ref 0.0–0.7)
HCT: 35.6 % — ABNORMAL LOW (ref 36.0–46.0)
HEMOGLOBIN: 12.1 g/dL (ref 12.0–15.0)
LYMPHS ABS: 1.3 10*3/uL (ref 0.7–4.0)
Lymphocytes Relative: 12.9 % (ref 12.0–46.0)
MCHC: 33.8 g/dL (ref 30.0–36.0)
MCV: 93.6 fl (ref 78.0–100.0)
MONOS PCT: 4.8 % (ref 3.0–12.0)
Monocytes Absolute: 0.5 10*3/uL (ref 0.1–1.0)
NEUTROS PCT: 81.3 % — AB (ref 43.0–77.0)
Neutro Abs: 8.3 10*3/uL — ABNORMAL HIGH (ref 1.4–7.7)
Platelets: 337 10*3/uL (ref 150.0–400.0)
RBC: 3.81 Mil/uL — AB (ref 3.87–5.11)
RDW: 15.1 % (ref 11.5–15.5)
WBC: 10.3 10*3/uL (ref 4.0–10.5)

## 2018-05-14 LAB — HEMOGLOBIN A1C: HEMOGLOBIN A1C: 6.1 % (ref 4.6–6.5)

## 2018-05-14 LAB — LIPID PANEL
CHOL/HDL RATIO: 3
Cholesterol: 168 mg/dL (ref 0–200)
HDL: 61.5 mg/dL (ref >39.00)
LDL CALC: 84 mg/dL (ref 0–99)
NONHDL: 106.48
Triglycerides: 113 mg/dL (ref 0.0–149.0)
VLDL: 22.6 mg/dL (ref 0.0–40.0)

## 2018-05-14 LAB — TSH: TSH: 0.83 u[IU]/mL (ref 0.35–4.50)

## 2018-05-14 NOTE — Progress Notes (Signed)
Patient: Brandi Fitzgerald MRN: [id number] DOB: [birth year] PCP: Orland Mustard, MD     Subjective:  Chief Complaint  Patient presents with  . Establish Care    HPI: The patient is a 62 y.o. female who presents today for annual exam. She denies any changes to past medical history. There have been no recent hospitalizations. They are following a well balanced diet and exercise plan. Weight has been stable. She is on chronic prednisone and seems to be gaining more weight.   Hypertension: Here for follow up of hypertension.  Currently on lisinopril 20mg  . Takes medication as prescribed and denies any side effects. Exercise includes water aerobics. Weight has been stable. Denies any chest pain, headaches, shortness of breath, vision changes, swelling in lower extremities.   Diabetes: Patient is here for follow up of type 2 diabetes. First diagnosed 12 years ago.  Currently on the following medications metformin. Takes medications as prescribed. Last A1C unsure. Currently exercising and following diabetic diet.  Denies any hypoglycemic events. Denies any vision changes, nausea, vomiting, abdominal pain, ulcers/paraesthesia in feet, polyuria, polydipsia or polyphagia. Denies any chest pain, shortness of breath. She is currently on 5mg  of prednisone daily per rheumatology. She has been on this for years.   Hyperlipidemia: currently on crestor. She has no fh of heart disease. She has a remote hx of smoking (25 years ago). +hx of diabetes and htn. Takes crestor daily and denies any side effects.   Immunization History  Administered Date(s) Administered  . Tdap 05/14/2018     Colonoscopy: 03/28/2017. F/u in 10 years.  Mammogram: 09/28/2017 Pap smear: 2018 Hiv/hep C today   Pneumovax: thinks she had this done.   Review of Systems  Constitutional: Positive for fatigue. Negative for chills and fever.  HENT: Negative for dental problem, ear pain, hearing loss and trouble swallowing.   Eyes: Negative  for visual disturbance.  Respiratory: Negative for cough, chest tightness and shortness of breath.   Cardiovascular: Negative for chest pain, palpitations and leg swelling.  Gastrointestinal: Negative for abdominal pain, blood in stool, diarrhea and nausea.  Endocrine: Negative for cold intolerance, polydipsia, polyphagia and polyuria.  Genitourinary: Positive for frequency. Negative for difficulty urinating, dysuria, flank pain, hematuria and urgency.  Musculoskeletal: Negative for arthralgias, back pain and neck pain.  Skin: Negative.  Negative for rash.  Neurological: Negative for dizziness and headaches.  Psychiatric/Behavioral: Negative for dysphoric mood and sleep disturbance. The patient is not nervous/anxious.     Allergies Patient is allergic to 09/30/2017 [sitagliptin phosphate]; aspirin; cephalosporins; and penicillins.  Past Medical History Patient  has a past medical history of Anxiety, Arthritis, Diabetes mellitus, Diverticulitis, Diverticulosis, High cholesterol, and Hypertension.  Surgical History Patient  has a past surgical history that includes Abdominal hysterectomy; Foot arthroplasty; Eye surgery; Total knee arthroplasty (Left, 02/18/2014); Wrist arthroscopy; and Bunionectomy.  Family History Pateint's family history includes Alcohol abuse in her father; Arthritis in her father, mother, sister, and sister; Diabetes in her sister; Drug abuse in her brother; Heart disease in her brother and father; Heart failure in her mother; Hyperlipidemia in her mother and sister; Hypertension in her mother and sister.  Social History Patient  reports that she quit smoking about 19 years ago. She has never used smokeless tobacco. She reports that she drinks about 1.2 oz of alcohol per week. She reports that she does not use drugs.    Objective: Vitals:   05/14/18 1045  BP: 130/78  Pulse: 69  Temp: 97.9 F (36.6 C)  TempSrc: Oral  SpO2: 97%  Weight: 206 lb 12.8 oz (93.8 kg)   Height: 5' 7.5" (1.715 m)    Body mass index is 31.91 kg/m.  Physical Exam  Constitutional: She is oriented to person, place, and time. She appears well-developed and well-nourished.  HENT:  Right Ear: External ear normal.  Left Ear: External ear normal.  Mouth/Throat: Oropharynx is clear and moist.  Tm pearly with light reflex bilaterally   Eyes: Pupils are equal, round, and reactive to light. Conjunctivae and EOM are normal.  Neck: Normal range of motion. Neck supple. No thyromegaly present.  Cardiovascular: Normal rate, regular rhythm, normal heart sounds and intact distal pulses.  No murmur heard. Negative carotid bruits bilaterally   Pulmonary/Chest: Effort normal and breath sounds normal.  Abdominal: Soft. Bowel sounds are normal. She exhibits no distension. There is no tenderness.  Lymphadenopathy:    She has no cervical adenopathy.  Neurological: She is alert and oriented to person, place, and time. She displays normal reflexes. No cranial nerve deficit. Coordination normal.  Skin: Skin is warm and dry. No rash noted.  Psychiatric: She has a normal mood and affect. Her behavior is normal.  Vitals reviewed.      GAD 7 : Generalized Anxiety Score 05/14/2018  Nervous, Anxious, on Edge 1  Control/stop worrying 0  Worry too much - different things 0  Trouble relaxing 0  Restless 0  Easily annoyed or irritable 0  Afraid - awful might happen 0  Total GAD 7 Score 1  Anxiety Difficulty Not difficult at all      Office Visit from 05/14/2018 in [facility]  PHQ-9 Total Score  3      Assessment/plan: 1. Annual physical exam Ordering DEXA for her long term steroid use. Requesting records so we can get pap/shot records/medical history. utd on cscope. tdap today. Hiv/hep C today. F/u in one year or as needed.  Patient counseling [x]    Nutrition: Stressed importance of moderation in sodium/caffeine intake, saturated fat and cholesterol, caloric balance,  sufficient intake of fresh fruits, vegetables, fiber, calcium, iron, and 1 mg of folate supplement per day (for females capable of pregnancy).  [x]    Stressed the importance of regular exercise.   [x]    Substance Abuse: Discussed cessation/primary prevention of tobacco, alcohol, or other drug use; driving or other dangerous activities under the influence; availability of treatment for abuse.   [x]    Injury prevention: Discussed safety belts, safety helmets, smoke detector, smoking near bedding or upholstery.   [x]    Sexuality: Discussed sexually transmitted diseases, partner selection, use of condoms, avoidance of unintended pregnancy  and contraceptive alternatives.  [x]    Dental health: Discussed importance of regular tooth brushing, flossing, and dental visits.  [x]    Health maintenance and immunizations reviewed. Please refer to Health maintenance section.     2. Diabetes mellitus without complication (HCC) On low dose metformin. May have steroid induced diabetes. Will check a1c and see if any adjustments are needed. Continue diet/weight loss and exercise. Hard to lose weight while on steroids. Would love to see if she could come off of these and she may talk with her rheum about this. Need eye exam and requesting she get me copy. Will do foot exam at next visit. Fu in 3-6 months.  - CBC with Differential/Platelet - Comprehensive metabolic panel - Hemoglobin A1c - Microalbumin / creatinine urine ratio - TSH  3. Essential hypertension Blood pressure is to goal. Continue current anti-hypertensive medications.  Routine lab work will be done today. Recommended routine exercise and healthy diet including DASH diet and mediterranean diet. Encouraged weight loss. F/u in 6 months.    4. Anxiety GAD7 score is non significant and anxiety well controlled. She is on no xanax or SSRI. Apparently old px. Anxiety is well controlled off medication. Continue exercise and let me know if any issues.   5.  Mixed hyperlipidemia  - Lipid panel  6. Encounter for hepatitis C screening test for low risk patient  - Hepatitis C antibody; Future - Hepatitis C antibody  7. Encounter for screening for HIV  - HIV antibody  8. Herpes simplex vulvovaginitis Unsure why they put her on daily suppressive medication. She has never had an outbreak. We are going to take her off medication and see how she does. Can treat outbreaks prn if rarely occur. She is sexually active and discussed they need to use condoms as she can transmit disease.   9. Need for Tdap vaccination  - Tdap vaccine greater than or equal to 7yo IM  10. Bad odor of urine No other symptoms. Checking ua/culture - POCT urinalysis dipstick - Urine Culture  11. Long term (current) use of systemic steroids x5 years on steroid. Needs bone scan. Ordered this today.  - DG Bone Density; Future     Return in about 6 months (around 11/14/2018).     Orland Mustard, MD [place] [facility]  05/14/2018

## 2018-05-15 ENCOUNTER — Telehealth: Payer: Self-pay | Admitting: Family Medicine

## 2018-05-15 LAB — HIV ANTIBODY (ROUTINE TESTING W REFLEX): HIV: NONREACTIVE

## 2018-05-15 LAB — URINE CULTURE
MICRO NUMBER:: 90922875
SPECIMEN QUALITY:: ADEQUATE

## 2018-05-15 LAB — HEPATITIS C ANTIBODY
Hepatitis C Ab: NONREACTIVE
SIGNAL TO CUT-OFF: 0.01 (ref <1.00)

## 2018-05-15 NOTE — Telephone Encounter (Signed)
See note

## 2018-05-15 NOTE — Telephone Encounter (Signed)
Medication refills for:  Metformin Atrovent Lisinopril Vitamin D Crestor  Approved and faxed to [facility]

## 2018-05-15 NOTE — Telephone Encounter (Signed)
Copied from CRM [id number]. Topic: General - Other >> May 15, 2018 12:52 PM Stephannie Li, NT wrote: Reason for CRM: Patient called and would like for all of her medications the same as they are

## 2018-05-17 ENCOUNTER — Telehealth: Payer: Self-pay | Admitting: Family Medicine

## 2018-05-17 NOTE — Telephone Encounter (Signed)
See note

## 2018-05-17 NOTE — Telephone Encounter (Signed)
Copied from CRM [id number]. Topic: Quick Communication - See Telephone Encounter >> May 17, 2018 12:38 PM Lorayne Bender wrote: CRM for notification. See Telephone encounter for: 05/17/18. Victorino Dike from [facility] needing clarification on ipratropium (ATROVENT) 0.03 % nasal spray and VITAMIN D, CHOLECALCIFEROL, PoVictorino Dike [phone] ref#: [id number]

## 2018-05-21 NOTE — Telephone Encounter (Signed)
Called and spoke with Victorino Dike at [facility].  Clarified dosage of Ipatropium nasal spray and Vitamin D caps.

## 2018-05-23 NOTE — Progress Notes (Signed)
Called and spoke with Diane from [facility].  Received duplicate requests for rx's for pt for Vitamin D-2 caps, Lisinopril, Ipatropium, Crestor, Montelukast tabs and Metformin, advised that requests were already clarified and faxed back on 8/6.  Diane confirmed receipt of all scripts.

## 2018-07-23 ENCOUNTER — Encounter: Payer: Self-pay | Admitting: Family Medicine

## 2018-07-23 ENCOUNTER — Ambulatory Visit (INDEPENDENT_AMBULATORY_CARE_PROVIDER_SITE_OTHER): Payer: BLUE CROSS/BLUE SHIELD | Admitting: Physician Assistant

## 2018-07-23 ENCOUNTER — Ambulatory Visit (INDEPENDENT_AMBULATORY_CARE_PROVIDER_SITE_OTHER): Payer: BLUE CROSS/BLUE SHIELD

## 2018-07-23 ENCOUNTER — Encounter: Payer: Self-pay | Admitting: Physician Assistant

## 2018-07-23 ENCOUNTER — Ambulatory Visit: Payer: BLUE CROSS/BLUE SHIELD | Admitting: Family Medicine

## 2018-07-23 VITALS — BP 126/88 | HR 85 | Temp 98.6°F | Ht 67.5 in | Wt 204.4 lb

## 2018-07-23 DIAGNOSIS — R109 Unspecified abdominal pain: Secondary | ICD-10-CM

## 2018-07-23 DIAGNOSIS — H538 Other visual disturbances: Secondary | ICD-10-CM

## 2018-07-23 DIAGNOSIS — Z23 Encounter for immunization: Secondary | ICD-10-CM | POA: Diagnosis not present

## 2018-07-23 DIAGNOSIS — R350 Frequency of micturition: Secondary | ICD-10-CM

## 2018-07-23 DIAGNOSIS — R413 Other amnesia: Secondary | ICD-10-CM

## 2018-07-23 LAB — CBC WITH DIFFERENTIAL/PLATELET
Basophils Absolute: 0.1 10*3/uL (ref 0.0–0.1)
Basophils Relative: 0.5 % (ref 0.0–3.0)
EOS PCT: 0.5 % (ref 0.0–5.0)
Eosinophils Absolute: 0.1 10*3/uL (ref 0.0–0.7)
HCT: 38.1 % (ref 36.0–46.0)
Hemoglobin: 12.4 g/dL (ref 12.0–15.0)
LYMPHS ABS: 1.3 10*3/uL (ref 0.7–4.0)
Lymphocytes Relative: 11.2 % — ABNORMAL LOW (ref 12.0–46.0)
MCHC: 32.5 g/dL (ref 30.0–36.0)
MCV: 93.8 fl (ref 78.0–100.0)
MONO ABS: 0.5 10*3/uL (ref 0.1–1.0)
MONOS PCT: 4.8 % (ref 3.0–12.0)
NEUTROS ABS: 9.4 10*3/uL — AB (ref 1.4–7.7)
NEUTROS PCT: 83 % — AB (ref 43.0–77.0)
PLATELETS: 351 10*3/uL (ref 150.0–400.0)
RBC: 4.06 Mil/uL (ref 3.87–5.11)
RDW: 15.6 % — AB (ref 11.5–15.5)
WBC: 11.3 10*3/uL — ABNORMAL HIGH (ref 4.0–10.5)

## 2018-07-23 LAB — URINALYSIS, ROUTINE W REFLEX MICROSCOPIC
BILIRUBIN URINE: NEGATIVE
KETONES UR: NEGATIVE
LEUKOCYTES UA: NEGATIVE
NITRITE: NEGATIVE
PH: 5.5 (ref 5.0–8.0)
Specific Gravity, Urine: 1.025 (ref 1.000–1.030)
Total Protein, Urine: NEGATIVE
Urine Glucose: NEGATIVE
Urobilinogen, UA: 0.2 (ref 0.0–1.0)

## 2018-07-23 LAB — VITAMIN B12: Vitamin B-12: 464 pg/mL (ref 211–911)

## 2018-07-23 LAB — COMPREHENSIVE METABOLIC PANEL
ALK PHOS: 80 U/L (ref 39–117)
ALT: 12 U/L (ref 0–35)
AST: 13 U/L (ref 0–37)
Albumin: 4.5 g/dL (ref 3.5–5.2)
BUN: 19 mg/dL (ref 6–23)
CHLORIDE: 101 meq/L (ref 96–112)
CO2: 33 meq/L — AB (ref 19–32)
Calcium: 9.9 mg/dL (ref 8.4–10.5)
Creatinine, Ser: 0.94 mg/dL (ref 0.40–1.20)
GFR: 77.07 mL/min (ref >60.00)
Glucose, Bld: 87 mg/dL (ref 70–99)
POTASSIUM: 4.5 meq/L (ref 3.5–5.1)
Sodium: 141 mEq/L (ref 135–145)
TOTAL PROTEIN: 7.4 g/dL (ref 6.0–8.3)
Total Bilirubin: 0.4 mg/dL (ref 0.2–1.2)

## 2018-07-23 NOTE — Patient Instructions (Addendum)
Let's have you return to see Dr. Artis Flock in 1 week to revisit all of the issues that were addressed today and in case you have others.  If you develop any worsening abdominal pain, and are unable to keep foods/liquids down, please seek urgent medical attention.  If you develop any severe confusion, severe headache, or other concerning symptoms, please seek urgent medical attention.  Memory Compensation Strategies  1. Use "WARM" strategy.  W= write it down  A= associate it  R= repeat it  M= make a mental note  2.   You can keep a Glass blower/designer.  Use a 3-ring notebook with sections for the following: calendar, important names and phone numbers,  medications, doctors' names/phone numbers, lists/reminders, and a section to journal what you did  each day.   3.    Use a calendar to write appointments down.  4.    Write yourself a schedule for the day.  This can be placed on the calendar or in a separate section of the Memory Notebook.  Keeping a  regular schedule can help memory.  5.    Use medication organizer with sections for each day or morning/evening pills.  You may need help loading it  6.    Keep a basket, or pegboard by the door.  Place items that you need to take out with you in the basket or on the pegboard.  You may also want to  include a message board for reminders.  7.    Use sticky notes.  Place sticky notes with reminders in a place where the task is performed.  For example: " turn off the stove" placed by the stove, "lock the door" placed on the door at eye level, " take your medications" on the bathroom mirror or by the place where you normally take your medications.  8.    Use alarms/timers.  Use while cooking to remind yourself to check on food or as a reminder to take your medicine, or as a reminder to make a call, or as a reminder to perform another task, etc.

## 2018-07-23 NOTE — Progress Notes (Addendum)
Brandi Fitzgerald is a 62 y.o. female here for a new problem.  History of Present Illness:   Chief Complaint  Patient presents with  . Memory Loss  . poss UTI   Patient presents for a multitude of complaints.  HPI  Memory Loss Having "memory loss" x 2 years. Mother and maternal uncle have had prior dx of Alzheimer's Dementia. Both sisters with memory loss and one brother, one sister recently had work-up but hasn't been very open about sharing it with patient. Patient states that she has been finding herself "suddenly unable being able remember things" and she is "spacing out." Can remember major dates and events in her life. Gets a "fog" and cannot specify much more than that and states that it occurs at least daily. She states that she sleeps "all day" but then states that it's about 8 hours a day. States she stays in bed all day because she is bored. She recently went to the local senior center to see if there was anything that she could participate but was told that she was too young.  She lives in [place], [place] and drives here for healthcare because she prefers the healthcare system here. She states that she doesn't have a great support system, has one friend and she lives in [place] and doesn't get to see her often. She no longer works. Husband was able to provide for her, he died about 3 years ago. 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  Lives alone, does like to read, garden and clean her home when she has the time.  She endorses a history of anxiety but denies depression. States that she often gets panic attacks, usually when she has to drive far distances by herself, "almost had one on the way here today."   She does take Norco 2-3 x a day and has for 3-4 x year. Dr. Dierdre Forth prescribes this for her and she states that she has told him that she wants to come off of this medication but she states "he said that I cannot come off of it."  Denies: confusion, recent  falls, slurred speech, changes in gait/balance, double vision, unusual HA  States that her vision seems a bit blurry today. Wears glasses for reading only. Eyes have been checked within the past year.  She had multiple lab tests performed in Aug 2019 by PCP, all normal, including TSH.  R-sided abdominal pain States that for the past 2-3 months she has had worsening "R sided" pain. Does not radiate. Constant 8/10. Denies history of constipation. Eats and drinks throughout the day. Denies specific triggers. Feels like a dull pain. Has not tried anything for the pain. Denies history of kidney stones.  Urinary Frequency Having symptoms x 2 months. Is going at least 12 times a day. Does have a history of diabetes but is currently well controlled, does not check blood sugars regularly. HgbA1c is currently 6.5. No burning sensation. Urine is "bubbly" like champagne. Slight odor. No blood. Very minimal smoking history in her teens. No nausea, chills, back pain. Has had two vaginal births. Denies prior history of incontinence of UTI.  Lab Results  Component Value Date   HGBA1C 6.1 05/14/2018       Past Medical History:  Diagnosis Date  . Anxiety   . Arthritis   . Diabetes mellitus   . Diverticulitis   . Diverticulosis   . High cholesterol   . Hypertension      Social  History   Socioeconomic History  . Marital status: Married    Spouse name: Not on file  . Number of children: Not on file  . Years of education: Not on file  . Highest education level: Not on file  Occupational History  . Not on file  Social Needs  . Financial resource strain: Not on file  . Food insecurity:    Worry: Not on file    Inability: Not on file  . Transportation needs:    Medical: Not on file    Non-medical: Not on file  Tobacco Use  . Smoking status: Former Smoker    Last attempt to quit: 01/28/1999    Years since quitting: 19.4  . Smokeless tobacco: Never Used  Substance and Sexual Activity  .  Alcohol use: Yes    Alcohol/week: 2.0 standard drinks    Types: 2 Standard drinks or equivalent per week    Comment: 2 drinks/week  . Drug use: No  . Sexual activity: Yes  Lifestyle  . Physical activity:    Days per week: Not on file    Minutes per session: Not on file  . Stress: Not on file  Relationships  . Social connections:    Talks on phone: Not on file    Gets together: Not on file    Attends religious service: Not on file    Active member of club or organization: Not on file    Attends meetings of clubs or organizations: Not on file    Relationship status: Not on file  . Intimate partner violence:    Fear of current or ex partner: Not on file    Emotionally abused: Not on file    Physically abused: Not on file    Forced sexual activity: Not on file  Other Topics Concern  . Not on file  Social History Narrative  . Not on file    Past Surgical History:  Procedure Laterality Date  . ABDOMINAL HYSTERECTOMY    . BUNIONECTOMY     left foot- twice  . EYE SURGERY     had sand in eye  . FOOT ARTHROPLASTY     Bil  . 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 WRIST ARTHROSCOPY     right    Family History  Problem Relation Age of Onset  . Heart failure Mother   . Hypertension Mother   . Hyperlipidemia Mother   . Arthritis Mother   . Alzheimer's disease Mother        late 61's  . Heart disease Father   . Alcohol abuse Father   . Arthritis Father   . Arthritis Sister   . Diabetes Sister   . Hyperlipidemia Sister   . Hypertension Sister   . Memory loss Sister   . Memory loss Brother   . Drug abuse Brother   . Heart disease Brother   . Arthritis Sister   . Memory loss Sister   . Alzheimer's disease Maternal Uncle     Allergies  Allergen Reactions  . Januvia [Sitagliptin Phosphate] Anaphylaxis  . Aspirin     Hives  .  Cephalosporins Swelling    Rash, itching  . Penicillins Itching and Swelling    Current Medications:   Current Outpatient Medications:  .  HYDROcodone-acetaminophen (NORCO) 7.5-325 MG per tablet, Take 1-2 tablets by mouth every 4 (  four) hours. (Patient taking differently: Take 1-2 tablets by mouth 4 (four) times daily as needed. ), Disp: 100 tablet, Rfl: 0 .  ipratropium (ATROVENT) 0.03 % nasal spray, Place 2 sprays into both nostrils daily., Disp: , Rfl:  .  lisinopril (PRINIVIL,ZESTRIL) 20 MG tablet, Take 20 mg by mouth every morning., Disp: , Rfl:  .  metFORMIN (GLUCOPHAGE) 500 MG tablet, Take 500 mg by mouth daily with breakfast., Disp: , Rfl:  .  ORENCIA 125 MG/ML SOSY, Inject 125 mg into the muscle once a week., Disp: , Rfl:  .  predniSONE (DELTASONE) 5 MG tablet, Take 5 mg by mouth daily with breakfast., Disp: , Rfl:  .  rosuvastatin (CRESTOR) 20 MG tablet, Take 20 mg by mouth. Take 1 and half pills daily, Disp: , Rfl:  .  valACYclovir (VALTREX) 500 MG tablet, valacyclovir hcl 500 mg tabs, Disp: , Rfl:  .  VITAMIN D, CHOLECALCIFEROL, PO, Take by mouth. Vit d 1.25 mg weekly, Disp: , Rfl:    Review of Systems:   ROS  Negative unless otherwise specified per HPI.  Vitals:   Vitals:   07/23/18 1133  BP: 126/88  Pulse: 85  Temp: 98.6 F (37 C)  TempSrc: Oral  SpO2: 96%  Weight: 204 lb 6.4 oz (92.7 kg)  Height: 5' 7.5" (1.715 m)     Body mass index is 31.54 kg/m.   Visual Acuity Screening   Right eye Left eye Both eyes  Without correction: 20/25 20/25 20/20   With correction:       Physical Exam:   Physical Exam  Constitutional: She appears well-developed. She is cooperative.  Non-toxic appearance. She does not have a sickly appearance. She does not appear ill. No distress.  Cardiovascular: Normal rate, regular rhythm, S1 normal, S2 normal, normal heart sounds and normal pulses.  No LE edema  Pulmonary/Chest: Effort normal and breath sounds normal.  Abdominal:  Normal appearance and bowel sounds are normal. There is no tenderness. There is no rigidity, no rebound, no guarding and no CVA tenderness.  Neurological: She is alert. She has normal strength. No cranial nerve deficit or sensory deficit. She displays a negative Romberg sign. Coordination and gait normal. GCS eye subscore is 4. GCS verbal subscore is 5. GCS motor subscore is 6.  Reflex Scores:      Patellar reflexes are 2+ on the right side and 0 on the left side. L knee with hx of surgery  Skin: Skin is warm, dry and intact.  Psychiatric: She has a normal mood and affect. Her speech is normal and behavior is normal.  Nursing note and vitals reviewed.  Abd xray:  IMPRESSION: Suspect bilateral nephrolithiasis. Small calcification overlies the right L4 transverse process and could reflect a mid right ureteral stone. This could be further evaluated with CT urogram if felt clinically indicated.  Assessment and Plan:    Debhora was seen today for memory loss and poss uti.  Diagnoses and all orders for this visit:  Urinary frequency UA pending. Could be related to some underlying constipation. Does have bilateral nephrolithiasis -- will defer management to PCP, she is not acutely symptomatic. Will treat any acute issues on UA. I recommended that she follow-up with PCP next week to discuss further work-up and trial of medication if warranted. -     Urine Culture -     Urinalysis, Routine w reflex microscopic  Memory loss No red flags on exam. Will repeat CBC, CMP and also check a B12. She  denies any acute worsening of symptoms, and rather a chronic issue. She does endorse being under a "fog" as well as a desire to stop her chronic pain medications. Will defer any changes of pain medication to her rheumatologist, Dr. Dierdre Forth. Record request is pending. Further treatment based on lab results. Follow-up with PCP in 1 week. She has a significant family history of Alzheimer's and has anxiety about  this. Consider MMSE at follow-up in 1 week. -     CBC with Differential/Platelet -     Comprehensive metabolic panel -     Vitamin B12  Right flank pain No red flags on exam. Official xray read pending from radiology. Will also await lab and urine results for further decision on possible treatment. Bilateral nephrolithiasis. -     DG Abd 2 Views; Future  Blurry vision, bilateral Vision relatively normal today -- she reports worsening complaint of blurry vision in patient exam room under "harsh" fluorescent light and improvement in the hallway.  -     Visual acuity screening  **Due to running out of time to address all of patient's other issues, I have asked her to return in 1 week to see PCP for further work-up of these issues as well as others she may have.  . Reviewed expectations re: course of current medical issues. . Discussed self-management of symptoms. . Outlined signs and symptoms indicating need for more acute intervention. . Patient verbalized understanding and all questions were answered. . See orders for this visit as documented in the electronic medical record. . Patient received an After-Visit Summary.  Jarold Motto, PA-C

## 2018-07-24 ENCOUNTER — Telehealth: Payer: Self-pay | Admitting: Family Medicine

## 2018-07-24 ENCOUNTER — Other Ambulatory Visit: Payer: Self-pay

## 2018-07-24 DIAGNOSIS — R109 Unspecified abdominal pain: Secondary | ICD-10-CM

## 2018-07-24 LAB — URINE CULTURE
MICRO NUMBER:: 91232434
SPECIMEN QUALITY:: ADEQUATE

## 2018-07-24 NOTE — Telephone Encounter (Signed)
No, 1 week is fine. Thank you sam for doing so much.

## 2018-07-24 NOTE — Telephone Encounter (Signed)
Called and spoke with patient and advised that she should follow up with Dr. Artis Flock early next week.  Will send message to Dr. Artis Flock to see if she wants patient to be seen next week or if advice can be given over the phone.  Advised pt that referral has been been placed for Urology.  Pt agrees with this plan.

## 2018-07-24 NOTE — Telephone Encounter (Signed)
Copied from CRM [id number]. Topic: General - Other >> Jul 24, 2018  3:18 PM Marylen Ponto wrote: Reason for CRM: Pt returned call to the office. Pt requests call back. Cb# [phone]

## 2018-07-25 NOTE — Telephone Encounter (Signed)
Called and spoke with patient and scheduled follow up visit for 10/23 @ 10am.  She verbalized understanding.

## 2018-08-01 ENCOUNTER — Ambulatory Visit (INDEPENDENT_AMBULATORY_CARE_PROVIDER_SITE_OTHER): Payer: BLUE CROSS/BLUE SHIELD | Admitting: Family Medicine

## 2018-08-01 ENCOUNTER — Encounter: Payer: Self-pay | Admitting: Family Medicine

## 2018-08-01 VITALS — BP 122/72 | HR 80 | Temp 98.2°F | Ht 67.5 in | Wt 206.8 lb

## 2018-08-01 DIAGNOSIS — R413 Other amnesia: Secondary | ICD-10-CM

## 2018-08-01 DIAGNOSIS — N200 Calculus of kidney: Secondary | ICD-10-CM | POA: Diagnosis not present

## 2018-08-01 DIAGNOSIS — R82998 Other abnormal findings in urine: Secondary | ICD-10-CM | POA: Diagnosis not present

## 2018-08-01 MED ORDER — DONEPEZIL HCL 5 MG PO TABS: 5.0000 mg | ORAL_TABLET | Freq: Every day | ORAL | 0 refills | Status: DC

## 2018-08-01 MED ORDER — TAMSULOSIN HCL 0.4 MG PO CAPS: ORAL_CAPSULE | ORAL | 0 refills | Status: DC

## 2018-08-01 NOTE — Progress Notes (Signed)
Patient: Brandi Fitzgerald MRN: [id number] DOB: 06-01-1956 PCP: Orland Mustard, MD     Subjective:  Chief Complaint  Patient presents with  . poss kidney stones    follow up    HPI: The patient is a 62 y.o. female who presents today for possible kidney stones. She was seen last week by PA for urinary frequency and right sided flank pain. Xray showed what could be kidney stones. Urine culture was negative. UA showed no protein and no RBC. Dipstick showed trace hgb. Since last week she has been drinking counin and lemon juice mixture to help her possible stones. She feels like her pain is better. She is going about 6-7 times a day. She feels like she is doing much better and pain has resolved. Her urine is now "bubbly" which is a new finding for her and has an odor. Doesn't smell like fish, but something she has never smelled before. No vaginal discharge, but she is sexually active.   Memory loss: has been struggling for 2 years with this. She is concerned because her mom had alzheimers, her uncle had it and her siblings are struggling with memory loss. She has left her oven on before and now she has to stay in the kitchen in order not to leave it on. She needs help with directions, but this is places she does not know. She is afraid to drive back home due to fear of getting loss. She has made this trip many times and needs directions to do this.   Review of Systems  Constitutional: Positive for fatigue. Negative for fever.  Respiratory: Negative for shortness of breath.   Cardiovascular: Negative for chest pain.  Gastrointestinal: Negative for abdominal pain, constipation, diarrhea and nausea.  Genitourinary: Positive for frequency. Negative for dysuria, flank pain, hematuria and urgency.  Musculoskeletal: Negative for back pain.       Lower back pain in spine area  Neurological: Negative for dizziness and headaches.  Psychiatric/Behavioral: Negative for agitation, behavioral problems,  confusion and decreased concentration.       Memory loss     Allergies Patient is allergic to [place] [sitagliptin phosphate]; aspirin; cephalosporins; and penicillins.  Past Medical History Patient  has a past medical history of Anxiety, Arthritis, Diabetes mellitus, Diverticulitis, Diverticulosis, High cholesterol, and Hypertension.  Surgical History Patient  has a past surgical history that includes Abdominal hysterectomy; Foot arthroplasty; Eye surgery; Total knee arthroplasty (Left, 02/18/2014); Wrist arthroscopy; and Bunionectomy.  Family History Pateint's family history includes Alcohol abuse in her father; Alzheimer's disease in her maternal uncle and mother; Arthritis in her father, mother, sister, and sister; Diabetes in her sister; Drug abuse in her brother; Heart disease in her brother and father; Heart failure in her mother; Hyperlipidemia in her mother and sister; Hypertension in her mother and sister; Memory loss in her brother, sister, and sister.  Social History Patient  reports that she quit smoking about 19 years ago. She has never used smokeless tobacco. She reports that she drinks about 2.0 standard drinks of alcohol per week. She reports that she does not use drugs.    Objective: Vitals:   08/01/18 1017  BP: 122/72  Pulse: 80  Temp: 98.2 F (36.8 C)  TempSrc: Oral  SpO2: 96%  Weight: 206 lb 12.8 oz (93.8 kg)  Height: 5' 7.5" (1.715 m)    Body mass index is 31.91 kg/m.  Physical Exam  Constitutional: She is oriented to person, place, and time. She appears well-developed and well-nourished.  HENT:  Right Ear: External ear normal.  Left Ear: External ear normal.  Mouth/Throat: Oropharynx is clear and moist.  Eyes: Pupils are equal, round, and reactive to light. Conjunctivae and EOM are normal.  Neck: Normal range of motion. Neck supple. No thyromegaly present.  Cardiovascular: Normal rate, regular rhythm, normal heart sounds and intact distal pulses.  No  murmur heard. Pulmonary/Chest: Effort normal and breath sounds normal.  Abdominal: Soft. Bowel sounds are normal. She exhibits no distension. There is no tenderness.  Musculoskeletal:  No cva tenderness   Lymphadenopathy:    She has no cervical adenopathy.  Neurological: She is alert and oriented to person, place, and time. She displays normal reflexes. No cranial nerve deficit. Coordination normal.  Skin: Skin is warm and dry. No rash noted.  Psychiatric: She has a normal mood and affect. Her behavior is normal.  Vitals reviewed.   MMSE score of 22.   Assessment/plan: 1. Memory loss MMSE abnormal with strong family history. Some concerning actions. Starting aricept and referral to neurology. Discussed medication and wrote everything down for her.  >40 minutes of appointment spent in administering MMSE and counseling regarding this.  - Ambulatory referral to Neurology  2. Frothy urine UA/culture not impressive. Will check 24 hour urine protein, but suspect this will be normal.  - Protein, urine, 24 hour; Future  3. Nephrolithiasis -asymptomatic now. May have passed. Already has f/u with urology, but she is to let me know if pain comes back. Will do CT on her but will hold off right now since asymptomatic and feeling much better. Also sent in 7 days of flomax to have on hand if she starts to have the flank pain so we can start this and order CT scan.    Return in about 4 months (around 12/02/2018) for routine check up .    Orland Mustard, MD [facility]   08/01/2018

## 2018-08-06 ENCOUNTER — Telehealth: Payer: Self-pay | Admitting: Family Medicine

## 2018-08-06 NOTE — Telephone Encounter (Signed)
Copied from CRM [id number]. Topic: Quick Communication - See Telephone Encounter >> Aug 06, 2018 10:40 AM Jens Som A wrote: CRM for notification. See Telephone encounter for: 08/06/18. Alcia form [facility] calling about rosuvastatin (CRESTOR) 20 MG tablet [[id number]] the previous script was written for Take 20 mg by mouth. Take 1 and half pills daily [facility] wanting to know if Dr. Artis Flock is ok with 1 pill daily? And confirm that the dose is now 20mg . Please advise Call back # [phone] Reference # [id number]

## 2018-08-06 NOTE — Telephone Encounter (Signed)
Never mind--you have to call [facility], but she can do one pill daily. #90 with 3 refills.

## 2018-08-06 NOTE — Telephone Encounter (Signed)
Please see msg and advise.  

## 2018-08-06 NOTE — Telephone Encounter (Signed)
See note

## 2018-08-06 NOTE — Telephone Encounter (Signed)
Contacted [facility] and clarified Crestor 20 mg 1 tablet qd.  Spoke with pharmacist.

## 2018-08-06 NOTE — Telephone Encounter (Signed)
Yes, she can take one pill daily. I sent in refill for her.

## 2018-08-08 ENCOUNTER — Other Ambulatory Visit: Payer: BLUE CROSS/BLUE SHIELD

## 2018-08-08 ENCOUNTER — Encounter: Payer: Self-pay | Admitting: Neurology

## 2018-08-08 DIAGNOSIS — R82998 Other abnormal findings in urine: Secondary | ICD-10-CM

## 2018-08-09 LAB — PROTEIN, URINE, 24 HOUR: Protein, 24H Urine: 137 mg/24 h (ref 0–149)

## 2018-08-13 LAB — URINE CULTURE: .: POSITIVE

## 2018-08-13 LAB — URINALYSIS, COMPLETE

## 2018-08-13 LAB — C-REACTIVE PROTEIN: .: 11.7

## 2018-08-16 ENCOUNTER — Encounter: Payer: Self-pay | Admitting: Family Medicine

## 2018-08-16 DIAGNOSIS — M797 Fibromyalgia: Secondary | ICD-10-CM | POA: Insufficient documentation

## 2018-08-17 ENCOUNTER — Other Ambulatory Visit: Payer: Self-pay

## 2018-08-17 MED ORDER — MONTELUKAST SODIUM 10 MG PO TABS: 10.0000 mg | ORAL_TABLET | Freq: Every day | ORAL | 3 refills | Status: DC

## 2018-08-17 NOTE — Telephone Encounter (Signed)
Fax received for refill on montelukast not filled by you in the past ok to fill?

## 2018-08-22 ENCOUNTER — Other Ambulatory Visit: Payer: Self-pay

## 2018-08-22 MED ORDER — MONTELUKAST SODIUM 10 MG PO TABS: 10.0000 mg | ORAL_TABLET | Freq: Every day | ORAL | 3 refills | Status: DC

## 2018-08-22 NOTE — Telephone Encounter (Signed)
Rx for Montelukast tabs sent to [facility], pt notified via phone call.  Apologized for mix up.

## 2018-08-22 NOTE — Telephone Encounter (Signed)
See note

## 2018-08-22 NOTE — Telephone Encounter (Signed)
Patient is calling and is upset that her prescription was sent to [facility] in [place] and not her mail service. She would like a call back from Dr. Artis Flock or her nurse in regards to this.  [facility] - [place], [place] - [address] [facility]  [address] [place] [address] [place] [address]  Phone: [phone] Fax: [phone]     CB# [phone]

## 2018-10-17 ENCOUNTER — Other Ambulatory Visit: Payer: Self-pay | Admitting: Family Medicine

## 2018-10-25 ENCOUNTER — Other Ambulatory Visit: Payer: Self-pay | Admitting: Family Medicine

## 2018-10-25 DIAGNOSIS — Z1231 Encounter for screening mammogram for malignant neoplasm of breast: Secondary | ICD-10-CM

## 2018-10-26 ENCOUNTER — Other Ambulatory Visit: Payer: Self-pay

## 2018-10-26 ENCOUNTER — Encounter: Payer: Self-pay | Admitting: Neurology

## 2018-10-26 ENCOUNTER — Ambulatory Visit: Payer: BLUE CROSS/BLUE SHIELD | Admitting: Neurology

## 2018-10-26 VITALS — BP 144/80 | HR 78 | Ht 67.5 in | Wt 213.0 lb

## 2018-10-26 DIAGNOSIS — R413 Other amnesia: Secondary | ICD-10-CM

## 2018-10-26 NOTE — Progress Notes (Signed)
NEUROLOGY CONSULTATION NOTE  Brandi Fitzgerald MRN: [id number] DOB: November 06, 1955  Referring provider: Dr. Orland Mustard Primary care provider: Dr. Orland Mustard  Reason for consult:  Memory loss  Dear Dr Artis Flock:  Thank you for your kind referral of Brandi Fitzgerald for consultation of the above symptoms. Although her history is well known to you, please allow me to reiterate it for the purpose of our medical record. She is alone in the office today. Records and images were personally reviewed where available.  HISTORY OF PRESENT ILLNESS: This is a 63 year old right-handed woman with a history of hypertension, hyperlipidemia, diabetes, RA, anxiety, presenting for evaluation of memory loss. She became tearful several times during the visit. She started noticing memory decline around 3 years ago. She reports her husband passed away 3 years ago and she may have had memory issues a little before that. She was his primary caregiver. She lives alone. She has found herself looking for things a lot. Cooking is beginning to be an issue because she has left the stove on a couple of times so she makes sure to stay in the kitchen. She denies getting lost driving. No missed bills or medications. She has a strong family history of dementia, which worries her more. Her mother had dementia and family has stated that "Aricept killed my mother." Her 2 sisters in their late 37s also have the same memory issues, as well as her 86 year old brother. No history of signifciant head injuries. No alcohol use.   She denies any headaches, dizziness, vision changes, focal numbness/tingling/weakness, bowel/bladder dysfunction, anosmia, or tremors. She has neck and back pain and has been taking hydrocodone four times a day for the past 3-4 years for rheumatoid arthritis. Sleep is good. No falls. She states she feels like she is isolated, she is at home a lot and does not socialize. She has lived in [place] the past 12 years. She has  noticed a lack of interest in activities since her husband passed away.   Laboratory Data: Lab Results  Component Value Date   HGBA1C 6.1 05/14/2018    Lab Results  Component Value Date   TSH 0.83 05/14/2018   Lab Results  Component Value Date   VITAMINB12 464 07/23/2018     PAST MEDICAL HISTORY: Past Medical History:  Diagnosis Date  . Anxiety   . Arthritis   . Diabetes mellitus   . Diverticulitis   . Diverticulosis   . High cholesterol   . Hypertension     PAST SURGICAL HISTORY: Past Surgical History:  Procedure Laterality Date  . ABDOMINAL HYSTERECTOMY    . BUNIONECTOMY     left foot- twice  . EYE SURGERY     had sand in eye  . FOOT ARTHROPLASTY     Bil  . 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 WRIST ARTHROSCOPY     right    MEDICATIONS: Current Outpatient Medications on File Prior to Visit  Medication Sig Dispense Refill  . HYDROcodone-acetaminophen (NORCO) 7.5-325 MG per tablet Take 1-2 tablets by mouth every 4 (four) hours. (Patient taking differently: Take 1-2 tablets by mouth 4 (four) times daily as needed. ) 100 tablet 0  . ipratropium (ATROVENT) 0.03 % nasal spray Place 2 sprays into both nostrils daily.    Marland Kitchen lisinopril (PRINIVIL,ZESTRIL) 20 MG tablet Take 20 mg by  mouth every morning.    . metFORMIN (GLUCOPHAGE) 500 MG tablet Take 500 mg by mouth daily with breakfast.    . ORENCIA 125 MG/ML SOSY Inject 125 mg into the muscle once a week.    . predniSONE (DELTASONE) 5 MG tablet Take 5 mg by mouth daily with breakfast.    . rosuvastatin (CRESTOR) 20 MG tablet TAKE 1 TABLET BY MOUTH  EVERY DAY 90 tablet 3  . VITAMIN D, CHOLECALCIFEROL, PO Take by mouth. Vit d 1.25 mg weekly    . donepezil (ARICEPT) 5 MG tablet Take 1 tablet (5 mg total) by mouth at bedtime. (Patient not taking: Reported on 10/26/2018) 30 tablet 0  .  montelukast (SINGULAIR) 10 MG tablet Take 1 tablet (10 mg total) by mouth at bedtime. (Patient not taking: Reported on 10/26/2018) 90 tablet 3  . valACYclovir (VALTREX) 500 MG tablet valacyclovir hcl 500 mg tabs     No current facility-administered medications on file prior to visit.     ALLERGIES: Allergies  Allergen Reactions  . Januvia [Sitagliptin Phosphate] Anaphylaxis  . Aspirin     Hives  . Cephalosporins Swelling    Rash, itching  . Penicillins Itching and Swelling    FAMILY HISTORY: Family History  Problem Relation Age of Onset  . Heart failure Mother   . Hypertension Mother   . Hyperlipidemia Mother   . Arthritis Mother   . Alzheimer's disease Mother        late 75's  . Heart disease Father   . Alcohol abuse Father   . Arthritis Father   . Arthritis Sister   . Diabetes Sister   . Hyperlipidemia Sister   . Hypertension Sister   . Memory loss Sister   . Memory loss Brother   . Drug abuse Brother   . Heart disease Brother   . Arthritis Sister   . Memory loss Sister   . Alzheimer's disease Maternal Uncle     SOCIAL HISTORY: Social History   Socioeconomic History  . Marital status: Married    Spouse name: Not on file  . Number of children: Not on file  . Years of education: Not on file  . Highest education level: Not on file  Occupational History  . Not on file  Social Needs  . Financial resource strain: Not on file  . Food insecurity:    Worry: Not on file    Inability: Not on file  . Transportation needs:    Medical: Not on file    Non-medical: Not on file  Tobacco Use  . Smoking status: Former Smoker    Last attempt to quit: 01/28/1999    Years since quitting: 19.7  . Smokeless tobacco: Never Used  Substance and Sexual Activity  . Alcohol use: Yes    Alcohol/week: 2.0 standard drinks    Types: 2 Standard drinks or equivalent per week    Comment: 2 drinks/week  . Drug use: No  . Sexual activity: Yes  Lifestyle  . Physical activity:     Days per week: Not on file    Minutes per session: Not on file  . Stress: Not on file  Relationships  . Social connections:    Talks on phone: Not on file    Gets together: Not on file    Attends religious service: Not on file    Active member of club or organization: Not on file    Attends meetings of clubs or organizations: Not on file  Relationship status: Not on file  . Intimate partner violence:    Fear of current or ex partner: Not on file    Emotionally abused: Not on file    Physically abused: Not on file    Forced sexual activity: Not on file  Other Topics Concern  . Not on file  Social History Narrative   Pt is right handed   Lives alone in 2 story home   Has 2 adult children   Has 15 year of education   Retired early from counseling    REVIEW OF SYSTEMS: Constitutional: No fevers, chills, or sweats, no generalized fatigue, change in appetite Eyes: No visual changes, double vision, eye pain Ear, nose and throat: No hearing loss, ear pain, nasal congestion, sore throat Cardiovascular: No chest pain, palpitations Respiratory:  No shortness of breath at rest or with exertion, wheezes GastrointestinaI: No nausea, vomiting, diarrhea, abdominal pain, fecal incontinence Genitourinary:  No dysuria, urinary retention or frequency Musculoskeletal:  No neck pain, back pain Integumentary: No rash, pruritus, skin lesions Neurological: as above Psychiatric: + depression, anxiety. No insomnia Endocrine: No palpitations, fatigue, diaphoresis, mood swings, change in appetite, change in weight, increased thirst Hematologic/Lymphatic:  No anemia, purpura, petechiae. Allergic/Immunologic: no itchy/runny eyes, nasal congestion, recent allergic reactions, rashes  PHYSICAL EXAM: Vitals:   10/26/18 1411  BP: (!) 144/80  Pulse: 78  SpO2: 93%   General: No acute distress Head:  Normocephalic/atraumatic Eyes: Fundoscopic exam shows bilateral sharp discs, no vessel changes,  exudates, or hemorrhages Neck: supple, no paraspinal tenderness, full range of motion Back: No paraspinal tenderness Heart: regular rate and rhythm Lungs: Clear to auscultation bilaterally. Vascular: No carotid bruits. Skin/Extremities: No rash, no edema Neurological Exam: Mental status: alert and oriented to person, place, and time. She was noted to give poor effort during memory testing. No dysarthria or aphasia, Fund of knowledge is appropriate.  Remote memory intact.  Attention and concentration are normal.    Able to name objects, difficulty with repetition and abstraction. Montreal Cognitive Assessment  10/30/2018  Visuospatial/ Executive (0/5) 4  Naming (0/3) 3  Attention: Read list of digits (0/2) 2  Attention: Read list of letters (0/1) 1  Attention: Serial 7 subtraction starting at 100 (0/3) 2  Language: Repeat phrase (0/2) 0  Language : Fluency (0/1) 1  Abstraction (0/2) 0  Delayed Recall (0/5) 0  Orientation (0/6) 6  Total 19   Cranial nerves: CN I: not tested CN II: pupils equal, round and reactive to light, visual fields intact, fundi unremarkable. CN III, IV, VI:  full range of motion, no nystagmus, no ptosis CN V: facial sensation intact CN VII: upper and lower face symmetric CN VIII: hearing intact to finger rub CN IX, X: gag intact, uvula midline CN XI: sternocleidomastoid and trapezius muscles intact CN XII: tongue midline Bulk & Tone: normal, no fasciculations. Motor: 5/5 throughout with no pronator drift. Sensation: decreased pin on left UE and LE, intact cold, vibration and joint position sense.  No extinction to double simultaneous stimulation.  Romberg test negative Deep Tendon Reflexes: +2 on right UE, otherwise +1 throughout, no ankle clonus Plantar responses: downgoing bilaterally Cerebellar: no incoordination on finger to nose, heel to shin. No dysdiadochokinesia Gait: narrow-based and steady, able to tandem walk adequately. Tremor: none Dec pin on  left UE and LE  IMPRESSION: This is a 63 year old right-handed woman with a history of hypertension, hyperlipidemia, diabetes, RA, anxiety, presenting for evaluation of memory loss. Her neurological exam  is largely non-focal with subjective sensory loss on the left side. MOCA score today 19/30, however there was note of poor effort during testing. We discussed different causes of memory loss. MRI brain without contrast will be ordered to assess for underlying structural abnormality and assess for vascular load. It appears memory changes started around the time her husband passed away, she has been isolating herself since then, we discussed how mood can affect memory. She will be scheduled for Neurocognitive testing to further evaluate cognitive complaints. We discussed the importance of control of vascular risk factors, physical exercise, and brain stimulation exercises for brain health. Follow-up after testing.   Thank you for allowing me to participate in the care of this patient. Please do not hesitate to call for any questions or concerns.   Patrcia Dolly, M.D.  CC: Dr. Artis Flock

## 2018-10-26 NOTE — Patient Instructions (Addendum)
1. Schedule MRI brain without contrast  We have sent a referral to [facility] for your MRI and they will call you directly to schedule your appt. They are located at [address] [facility]. If you need to contact them directly please call [phone].   2. Schedule Neurocognitive testing at [place]  3. Recommend contacting the [employer] [facility] to ask about options/resources: [phone]  [phone]  4. Follow-up after testing   RECOMMENDATIONS FOR ALL PATIENTS WITH MEMORY PROBLEMS: 1. Continue to exercise (Recommend 30 minutes of walking everyday, or 3 hours every week) 2. Increase social interactions - continue going to [place] and enjoy social gatherings with friends and family 3. Eat healthy, avoid fried foods and eat more fruits and vegetables 4. Maintain adequate blood pressure, blood sugar, and blood cholesterol level. Reducing the risk of stroke and cardiovascular disease also helps promoting better memory. 5. Avoid stressful situations. Live a simple life and avoid aggravations. Organize your time and prepare for the next day in anticipation. 6. Sleep well, avoid any interruptions of sleep and avoid any distractions in the bedroom that may interfere with adequate sleep quality 7. Avoid sugar, avoid sweets as there is a strong link between excessive sugar intake, diabetes, and cognitive impairment The Mediterranean diet has been shown to help patients reduce the risk of progressive memory disorders and reduces cardiovascular risk. This includes eating fish, eat fruits and green leafy vegetables, nuts like almonds and hazelnuts, walnuts, and also use olive oil. Avoid fast foods and fried foods as much as possible. Avoid sweets and sugar as sugar use has been linked to worsening of memory function.  There is always a concern of gradual progression of memory problems. If this is the case, then we may need to adjust level of care according to patient needs.  Support, both to the patient and caregiver, should then be put into place.

## 2018-11-12 ENCOUNTER — Ambulatory Visit
Admission: RE | Admit: 2018-11-12 | Discharge: 2018-11-12 | Disposition: A | Discharge status: Home / Self Care | Payer: BLUE CROSS/BLUE SHIELD | Source: Ambulatory Visit | Attending: Neurology | Admitting: Neurology

## 2018-11-12 DIAGNOSIS — R413 Other amnesia: Secondary | ICD-10-CM

## 2018-11-14 ENCOUNTER — Ambulatory Visit: Payer: BLUE CROSS/BLUE SHIELD | Admitting: Family Medicine

## 2018-11-22 ENCOUNTER — Telehealth: Payer: Self-pay

## 2018-11-22 ENCOUNTER — Ambulatory Visit: Payer: BLUE CROSS/BLUE SHIELD

## 2018-11-22 DIAGNOSIS — R413 Other amnesia: Secondary | ICD-10-CM

## 2018-11-22 NOTE — Telephone Encounter (Signed)
Pt called office stating that she was returning a call.  There is no record of call to pt.  I asked if whomever called pt left a message, pt states no.  I advised that I have not called pt.  Pt also states that she was unable to have MRI done.  Says that it was suggested that she either have open MRI or Rx for anti-anxiety medication.  Dr. Karel Jarvis - would you like me to send Rx or order for OPEN MRI?

## 2018-11-23 NOTE — Telephone Encounter (Signed)
Orders for OPEN MRI placed in system

## 2018-11-23 NOTE — Telephone Encounter (Signed)
Let's do open MRI and also give prn Valium for the MRI. Thanks

## 2018-11-23 NOTE — Addendum Note (Signed)
Addended by: Horatio Pel on: 11/23/2018 09:27 AM   Modules accepted: Orders

## 2018-12-19 ENCOUNTER — Ambulatory Visit: Payer: BLUE CROSS/BLUE SHIELD | Admitting: Family Medicine

## 2018-12-21 ENCOUNTER — Ambulatory Visit: Payer: BLUE CROSS/BLUE SHIELD | Admitting: Family Medicine

## 2018-12-24 NOTE — Progress Notes (Deleted)
Patient: Brandi Fitzgerald MRN: [id number] DOB: 1955/11/20 PCP: Orland Mustard, MD     Subjective:  No chief complaint on file.   HPI: The patient is a 63 y.o. female who presents today for ***  Review of Systems  Allergies Patient is allergic to [place] [sitagliptin phosphate]; aspirin; cephalosporins; and penicillins.  Past Medical History Patient  has a past medical history of Anxiety, Arthritis, Diabetes mellitus, Diverticulitis, Diverticulosis, High cholesterol, and Hypertension.  Surgical History Patient  has a past surgical history that includes Abdominal hysterectomy; Foot arthroplasty; Eye surgery; Total knee arthroplasty (Left, 02/18/2014); Wrist arthroscopy; and Bunionectomy.  Family History Pateint's family history includes Alcohol abuse in her father; Alzheimer's disease in her maternal uncle and mother; Arthritis in her father, mother, sister, and sister; Diabetes in her sister; Drug abuse in her brother; Heart disease in her brother and father; Heart failure in her mother; Hyperlipidemia in her mother and sister; Hypertension in her mother and sister; Memory loss in her brother, sister, and sister.  Social History Patient  reports that she quit smoking about 19 years ago. She has never used smokeless tobacco. She reports current alcohol use of about 2.0 standard drinks of alcohol per week. She reports that she does not use drugs.    Objective: There were no vitals filed for this visit.  There is no height or weight on file to calculate BMI.  Physical Exam     Assessment/plan:      No follow-ups on file.     @[facility] @ 12/24/2018

## 2018-12-26 ENCOUNTER — Ambulatory Visit: Payer: BLUE CROSS/BLUE SHIELD

## 2018-12-26 ENCOUNTER — Telehealth: Payer: Self-pay

## 2018-12-26 NOTE — Telephone Encounter (Signed)
See note

## 2018-12-26 NOTE — Telephone Encounter (Signed)
Left detailed voicemail message on patient's cell phone in regard to appt tomorrow 3/19 for memory issues.  Advised that Dr. Artis Flock had referred patient to neurology at last visit for this and wanted to find out if she had followed up with them.  We are also trying to avoid healthy patients coming in to the office at this time as to lessen poss exposure to the coronavirus.  Requested patient to call back.

## 2018-12-26 NOTE — Telephone Encounter (Signed)
Patient stated her memory is worse but she is going to hold off any appts until after the Tommi Emery crisis is over.

## 2018-12-27 NOTE — Telephone Encounter (Signed)
Noted  

## 2018-12-28 ENCOUNTER — Ambulatory Visit: Payer: BLUE CROSS/BLUE SHIELD | Admitting: Family Medicine

## 2019-01-14 ENCOUNTER — Other Ambulatory Visit: Payer: Self-pay | Admitting: Family Medicine

## 2019-01-14 NOTE — Telephone Encounter (Signed)
See note

## 2019-01-14 NOTE — Telephone Encounter (Signed)
Can you call and see how she is doing? At her annual exam last year I took her off suppressive therapy (daily medication of her valtrex) and told her we would see how she does. Did she stop this last year? Or has she continued with daily medication? I would like to see how she does off medication and only treat outbreaks unless having frequent outbreaks.   Orland Mustard, MD [facility]

## 2019-01-14 NOTE — Telephone Encounter (Signed)
Please review refill for valtrex 500 mg tab. Last reordered on 07/23/18 by historical provider. Orland Mustard, PCP LOV 08/01/18

## 2019-01-14 NOTE — Telephone Encounter (Signed)
Doesn't need appointment. I will send in just to verify how she is taking this medicaiton.

## 2019-01-15 ENCOUNTER — Other Ambulatory Visit: Payer: Self-pay

## 2019-01-15 MED ORDER — VALACYCLOVIR HCL 500 MG PO TABS: ORAL_TABLET | ORAL | 1 refills | Status: DC

## 2019-01-15 NOTE — Telephone Encounter (Signed)
Called and spoke with patient and verified that she is not taking medication daily.  She actually has not had an outbreak but wants to have medication on hand in case that she does.  Refill to be sent in to [facility].

## 2019-01-16 ENCOUNTER — Telehealth: Payer: Self-pay

## 2019-01-16 MED ORDER — VALACYCLOVIR HCL 500 MG PO TABS: ORAL_TABLET | ORAL | 3 refills | Status: AC

## 2019-01-16 NOTE — Addendum Note (Signed)
Addended by: Orland Mustard on: 01/16/2019 01:15 PM   Modules accepted: Orders

## 2019-01-16 NOTE — Telephone Encounter (Signed)
Wrong sig sent in by nurse. Patient is on recurrent treatment, not suppressive. Has had no outbreaks since we stopped her daily pill. Resent in her correct dosage/sig.  Orland Mustard, MD [facility]

## 2019-01-16 NOTE — Telephone Encounter (Signed)
Late documentation: 01/15/19. Spoke to patient and confirmed that patient takes Valacyclovir only for breakouts, of which she has not had any to date.  She wants to make sure that she has the medication on hand in case that she does have a breakout.

## 2019-01-16 NOTE — Telephone Encounter (Signed)
Spoke to pharmacist ([facility]) and cancelled order sent in yesterday for Valacyclovir and new rx w/new directions sent in.

## 2019-01-18 ENCOUNTER — Other Ambulatory Visit: Payer: Self-pay | Admitting: Family Medicine

## 2019-01-24 NOTE — Telephone Encounter (Signed)
She is overdue for a1c check. Needs to be seen every 6 months. Was due in February. Could come in for labs and then do doxy appointment.   Brandi Mustard, MD [facility]

## 2019-01-25 NOTE — Telephone Encounter (Signed)
Spoke to patient and scheduled follow up appt for diabetes 4/22.

## 2019-01-30 ENCOUNTER — Other Ambulatory Visit: Payer: Self-pay

## 2019-01-30 ENCOUNTER — Ambulatory Visit: Payer: BLUE CROSS/BLUE SHIELD | Admitting: Family Medicine

## 2019-01-30 ENCOUNTER — Encounter: Payer: Self-pay | Admitting: Family Medicine

## 2019-01-30 VITALS — BP 118/78 | HR 77 | Temp 97.4°F | Ht 67.5 in | Wt 213.0 lb

## 2019-01-30 DIAGNOSIS — E119 Type 2 diabetes mellitus without complications: Secondary | ICD-10-CM | POA: Diagnosis not present

## 2019-01-30 DIAGNOSIS — R413 Other amnesia: Secondary | ICD-10-CM

## 2019-01-30 DIAGNOSIS — R0789 Other chest pain: Secondary | ICD-10-CM

## 2019-01-30 DIAGNOSIS — I10 Essential (primary) hypertension: Secondary | ICD-10-CM

## 2019-01-30 MED ORDER — LISINOPRIL 20 MG PO TABS: 20.0000 mg | ORAL_TABLET | Freq: Every morning | ORAL | 3 refills | Status: DC

## 2019-01-30 MED ORDER — DONEPEZIL HCL 5 MG PO TABS: 5.0000 mg | ORAL_TABLET | Freq: Every day | ORAL | 1 refills | Status: DC

## 2019-01-30 MED ORDER — METFORMIN HCL 500 MG PO TABS: ORAL_TABLET | ORAL | 3 refills | Status: DC

## 2019-01-30 MED ORDER — ROSUVASTATIN CALCIUM 20 MG PO TABS: 20.0000 mg | ORAL_TABLET | Freq: Every day | ORAL | 3 refills | Status: DC

## 2019-01-30 MED ORDER — MONTELUKAST SODIUM 10 MG PO TABS: 10.0000 mg | ORAL_TABLET | Freq: Every day | ORAL | 3 refills | Status: DC

## 2019-01-30 NOTE — Patient Instructions (Addendum)
I want you to call [facility] to set up follow up appointment with Dr. Karel Jarvis (the neurologist you saw in [place])  [phone]. She ordered a MRI and this was not done and you did not follow up.   Also re started you on your aricept, a memory pill.   For your chest pain: ekg looks good. Ordered a treadmill stress test to investigate this further. Will call you to set this up. If chest pain persists or gets worse... need to go to ER

## 2019-01-30 NOTE — Progress Notes (Signed)
Patient: Brandi Fitzgerald MRN: [id number] DOB: November 13, 1955 PCP: Orland Mustard, MD     Subjective:  Chief Complaint  Patient presents with  . Hypertension  . Diabetes    HPI: The patient is a 63 y.o. female who presents today for diabetes and HTN.   Diabetes: Patient is here for follow up of type 2 diabetes.  Currently on the following medications: metformin 500mg /day. Takes medications as prescribed. Last A1C was 6.1 in 05/2018. Currently exercising and following diabetic diet. Denies any hypoglycemic events. Denies any vision changes, nausea, vomiting, abdominal pain, ulcers/paraesthesia in feet, polyuria, polydipsia or polyphagia. Denies any chest pain, shortness of breath.   Hypertension: Here for follow up of hypertension.  Currently on lisinopril 20mg /day. Does not take her BP at home.  Takes medication as prescribed and denies any side effects. Exercise includes walking/has gym at home. Weight has been stable. Denies any chest pain, headaches, shortness of breath, vision changes, swelling in lower extremities.   Memory loss: saw neurology in January. Was supposed to get MRI and never did this. She doesn't remember even seeing Dr. Karel Jarvis in [place] and has no recollection that she was supposed to get MRI or follow up with her. She also doesn't remember that I started her on Aricept back in October. She feels like her memory is worse. She doesn't really drive much anymore. She has someone drive her.   Chest pain: started about a month ago and located more on left side of anterior chest wall. Pain lasts 2-3 minutes. Pain is like pressure. Pain is not worse with exertion. Usually happens when she is sitting down and relaxed. She does not smoke, has extremely well controlled diabetes and hyperlipidemia. Current only crestor 20mg . Unsure if heart disease in family. None in her father. She denies any pain down her arm or up her jaw. No diaphoresis or shortness of breath associated with this.    Review of Systems  Constitutional: Negative for chills, fatigue and fever.  HENT: Negative for congestion.   Respiratory: Negative for cough, shortness of breath and wheezing.   Cardiovascular: Positive for chest pain. Negative for palpitations and leg swelling.  Gastrointestinal: Negative for abdominal pain, diarrhea, nausea and vomiting.  Genitourinary: Negative for dysuria, frequency, hematuria and urgency.  Neurological: Negative for dizziness, syncope, weakness and headaches.  Psychiatric/Behavioral: Negative for decreased concentration, dysphoric mood and sleep disturbance. The patient is not nervous/anxious.        Poor memory     Allergies Patient is allergic to [place] [sitagliptin phosphate]; aspirin; cephalosporins; and penicillins.  Past Medical History Patient  has a past medical history of Anxiety, Arthritis, Diabetes mellitus, Diverticulitis, Diverticulosis, High cholesterol, and Hypertension.  Surgical History Patient  has a past surgical history that includes Abdominal hysterectomy; Foot arthroplasty; Eye surgery; Total knee arthroplasty (Left, 02/18/2014); Wrist arthroscopy; and Bunionectomy.  Family History Pateint's family history includes Alcohol abuse in her father; Alzheimer's disease in her maternal uncle and mother; Arthritis in her father, mother, sister, and sister; Diabetes in her sister; Drug abuse in her brother; Heart disease in her brother and father; Heart failure in her mother; Hyperlipidemia in her mother and sister; Hypertension in her mother and sister; Memory loss in her brother, sister, and sister.  Social History Patient  reports that she quit smoking about 20 years ago. She has never used smokeless tobacco. She reports current alcohol use of about 2.0 standard drinks of alcohol per week. She reports that she does not use drugs.  Objective: Vitals:   01/30/19 1039  BP: 118/78  Pulse: 77  Temp: (!) 97.4 F (36.3 C)  TempSrc: Oral  SpO2:  97%  Weight: 213 lb (96.6 kg)  Height: 5' 7.5" (1.715 m)    Body mass index is 32.87 kg/m.  Physical Exam Vitals signs reviewed.  Constitutional:      Appearance: She is obese.  HENT:     Head: Normocephalic and atraumatic.  Eyes:     Extraocular Movements: Extraocular movements intact.     Pupils: Pupils are equal, round, and reactive to light.  Neck:     Musculoskeletal: Normal range of motion and neck supple.  Cardiovascular:     Rate and Rhythm: Normal rate and regular rhythm.     Heart sounds: Normal heart sounds.     Comments: No carotid bruits  Pulmonary:     Effort: Pulmonary effort is normal.     Breath sounds: Normal breath sounds.  Abdominal:     General: Abdomen is flat. Bowel sounds are normal.     Palpations: Abdomen is soft.  Musculoskeletal:     Comments: Reproducible chest pain over anterior chest wall   Skin:    General: Skin is warm and dry.     Capillary Refill: Capillary refill takes less than 2 seconds.  Neurological:     General: No focal deficit present.     Mental Status: She is alert and oriented to person, place, and time.  Psychiatric:        Mood and Affect: Mood normal.        Behavior: Behavior normal.    Ekg: nsr with rate of 72. No st wave elevation. On prior ekg for comparison.     Assessment/plan: 1. Diabetes mellitus without complication (HCC) Overdue for her a1c. Was extremely well controlled on last check. Refilled her metformin. Needs eye exam/foot exam. F/u in 6 months or as needed.  - Hemoglobin A1c  2. Essential hypertension Blood pressure is to goal. Continue current anti-hypertensive medications. Refills given and routine lab work will be done today. Recommended routine exercise and healthy diet including DASH diet and mediterranean diet. Encouraged weight loss. F/u in 6 months.   3. Memory loss -number given for neurology to call for follow up. Discussed likely will have to push back her MRI due to covid.   4. Chest  pain:  -sounds more atypical as none with activity and reproducible, but hx of diabetes/htn/hld. Will do stress test and this was ordered for her. In meantime, if any chest pain that is worsening, down arm, up jaw, shortness of breath, etc. She needs to call 911 or go to ER. Concern that she will not remember to f/u with this.  - CBC with Differential/Platelet - Comprehensive metabolic panel  --patient left office after EKG done despite being told I was coming back in to talk to her and give her all of the paperwork with instructions. Attempted to call patient throughout day with no success. Left VM for her to return call to clinic. She also did not have any labs done.   Return in about 6 months (around 08/01/2019).   Orland Mustard, MD [facility]  01/30/2019

## 2019-02-01 ENCOUNTER — Other Ambulatory Visit: Payer: Self-pay | Admitting: Family Medicine

## 2019-02-01 MED ORDER — DONEPEZIL HCL 5 MG PO TABS: 5.0000 mg | ORAL_TABLET | Freq: Every day | ORAL | 1 refills | Status: DC

## 2019-02-01 NOTE — Telephone Encounter (Signed)
Sent to wrong pharmacy per patient

## 2019-02-13 ENCOUNTER — Telehealth: Payer: Self-pay

## 2019-02-13 NOTE — Telephone Encounter (Signed)
Left voicemail message on patient's cell phone requesting a call back in regards to last OV.

## 2019-02-14 ENCOUNTER — Telehealth: Payer: Self-pay

## 2019-02-14 ENCOUNTER — Telehealth: Payer: Self-pay | Admitting: Family Medicine

## 2019-02-14 NOTE — Telephone Encounter (Signed)
I have sent valacylovir in to her mail order a few weeks ago... did they not get this?

## 2019-02-14 NOTE — Telephone Encounter (Signed)
Copied from CRM [id number]. Topic: General - Other >> Feb 14, 2019  9:31 AM Percival Spanish wrote:   Pt returned Boomer call , and she req refill on valACYclovir (VALTREX) 500 MG tablet

## 2019-02-14 NOTE — Telephone Encounter (Signed)
See telephone encounter dated 5/7.  Spoke with patient.

## 2019-02-14 NOTE — Telephone Encounter (Signed)
Spoke with patient and had lengthy conversation about recommendations per Dr. Artis Flock from 4/22 visit.  After patient had ekg at this visit, she left before Dr. Artis Flock could go in to speak with her to review all information on her AVS.  Patient was advised to contact  Neurology and schedule a follow up visit w/Dr. Karel Jarvis.  I also advised patient that she needs to get MRI done that Dr. Karel Jarvis had ordered back in January of this year.    Patient states that she feels that the Aricept that Dr. Artis Flock recently restarted her on is making her memory worse.  Per Dr. Artis Flock, all the more reason to get in with neurology and get her MRI done.  I further advised patient that she was supposed to have labs done on 4/22 when she was in the office also.  Advised patient to contact me when she has her MRI appt scheduled and we can do her labs the same day since she has to come all the way here from [place].  I strongly encouraged patient to take notes and write down everything that I was saying as we spoke over the phone.  She seemed to verbalize understanding of all of the above and will contact Dr. Rosalyn Gess office to schedule follow up visit and get the ball rolling with getting MRI scheduled.

## 2019-03-01 ENCOUNTER — Telehealth: Payer: Self-pay | Admitting: Family Medicine

## 2019-03-01 NOTE — Telephone Encounter (Signed)
Copied from CRM [id number]. Topic: General - Other >> Mar 01, 2019 11:38 AM Dalphine Handing A wrote: Patient would like a callback from Dr. Artis Flock nurse. Patient feels that she needs assistance when traveling to appointments and at home assistance.

## 2019-03-01 NOTE — Telephone Encounter (Signed)
Let her know that this would be through private means. Home health does not provide this kind of care and usually done through something like [employer] or some other means of private pay.

## 2019-03-05 NOTE — Telephone Encounter (Signed)
See note

## 2019-03-05 NOTE — Telephone Encounter (Signed)
See request °

## 2019-03-05 NOTE — Telephone Encounter (Signed)
Copied from CRM [id number]. Topic: Quick Communication - Rx Refill/Question >> Mar 05, 2019  9:38 AM Herby Abraham C wrote: Medication: HYDROcodone-acetaminophen (NORCO) 7.5-325 MG per tablet  Has the patient contacted their pharmacy? No  (Agent: If no, request that the patient contact the pharmacy for the refill.) (Agent: If yes, when and what did the pharmacy advise?)  Preferred Pharmacy (with phone number or street name): [facility]   Agent: Please be advised that RX refills may take up to 3 business days. We ask that you follow-up with your pharmacy.

## 2019-03-05 NOTE — Telephone Encounter (Signed)
Patient called back to say that she is in terrible pain. Asking for a call back from Dr Artis Flock Ph# [phone]

## 2019-03-05 NOTE — Telephone Encounter (Signed)
Pt called in to speak with someone in regards to her having her MRI. Re-advised pt of message below from [place]. Pt says that she has not spoken to anyone and that everything that I read to her is new to her (her first time hearing) pt says that she doesn't recall speaking with anyone being advised. Pt says that she is not sure who Dr. Karel Jarvis is and she is also not sure of how to go about having MRI. Pt would like a call back.

## 2019-03-06 NOTE — Telephone Encounter (Signed)
Brandi Fitzgerald- See if you can call patient and see if there is a family member that I can speak with as I am very concerned about her memory decline. We have spent hours on phone with no avail as she can not remember plan/what we have said to her and we need some kind of family involvement for care to be done.   Orland Mustard, MD [facility]

## 2019-03-06 NOTE — Telephone Encounter (Signed)
I called the patient to advise of Dr. Blair Heys message. Patient stated she would call the rheumatologist. No further action required from [facility]. Routing only for documentation purposes.

## 2019-03-06 NOTE — Telephone Encounter (Signed)
Spoke with patient and re-advised of information relayed to her in our previous telephone conversation from 5/7.  Dr. Artis Flock is deeply concerned about her steadily declining memory issues and we need to speak w/family member or friend to assist.  Patient again did not recall the conversation that I had w/her on 5/7 and was completely confused as to what I was talking about.  She did give me the name "Brandi Fitzgerald" and reports that he is a close friend (he is actually listed as an emergency contact in her chart) and verbally authorized [place] to speak with Mr. Brandi Fitzgerald regarding her PHI.  I advised patient that Dr. Artis Flock would contact Leonette Most tomorrow on 5/28 and advised her to take notes and write down everything that I was telling her and to repeat it back to me.  Patient verbalized understanding and did as I instructed her to.  She reports that she has a mirror that she tapes notes to every day to help her remember.  I advised her to tape this note to her mirror to remind her that Dr. Artis Flock would be contacting her friend tomorrow 5/28 so that we could get her some help.  She again seemed to verbalize understanding.

## 2019-03-06 NOTE — Telephone Encounter (Signed)
I do not prescribe this for her. She gets this from her rheumatologist and will need to call them.

## 2019-03-07 ENCOUNTER — Other Ambulatory Visit: Payer: Self-pay | Admitting: Family Medicine

## 2019-03-07 DIAGNOSIS — E119 Type 2 diabetes mellitus without complications: Secondary | ICD-10-CM

## 2019-03-07 NOTE — Telephone Encounter (Signed)
lmom for both patient and her friend

## 2019-03-07 NOTE — Telephone Encounter (Signed)
Charles returned call. He states he is very involved in Brandi Fitzgerald's life and sees her everyday. All of her kids live in [place]. He states he has seen her short term memory decline, but seems to answer his questions okay. Understands my concern. Discussed there are numerous log notes of her not remembering our conversations and I have seen a rapid decline in her memory and am concerned as there has been no family to help her get the medical care that I believe she needs. She is also at home by herself during the day. He states he will drive her to appointments if needed. Information given for Dr. Rosalyn Gess office to schedule follow up appointment and then MRI again. He will also come by here for her lab recheck that she never did when she was here for appointment.

## 2019-03-07 NOTE — Telephone Encounter (Signed)
Pls call patient to schedule e-visit and pls have her friend Leonette Most present during the visit (pls call Leonette Most about appt as well, he is on her Emergency contacts). Will need [facility] for visit. Thanks

## 2019-03-07 NOTE — Telephone Encounter (Signed)
Attempted to call charles. VM left to return call.

## 2019-03-11 NOTE — Telephone Encounter (Signed)
fyi

## 2019-03-11 NOTE — Telephone Encounter (Signed)
Spoke with patient she does not want to make appt at this time

## 2019-06-03 ENCOUNTER — Other Ambulatory Visit: Payer: Self-pay | Admitting: Family Medicine

## 2019-07-24 ENCOUNTER — Other Ambulatory Visit: Payer: Self-pay

## 2019-07-24 ENCOUNTER — Ambulatory Visit: Payer: Self-pay

## 2019-07-24 DIAGNOSIS — R413 Other amnesia: Secondary | ICD-10-CM

## 2019-07-24 NOTE — Telephone Encounter (Signed)
Please call to schedule w/ Dr Rogers Blocker ASAP per Triage note.

## 2019-07-24 NOTE — Telephone Encounter (Signed)
Left detailed voicemail message on patient's cell phone regarding earlier triage message.  Requested a call back from patient.

## 2019-07-24 NOTE — Telephone Encounter (Signed)
Called patient and she stated she couldn't come today. Patient stated that she would call back later to make an appt she needed someone to come with her since she having memory issues.

## 2019-07-24 NOTE — Telephone Encounter (Signed)
Please advise 

## 2019-07-24 NOTE — Telephone Encounter (Signed)
She has had her urine worked up before and was normal. No excessive protein. It's the same complaint. She has had poor follow up. Her diabetes was well controlled on last check with a1c of 6.1 and I don't have her check her sugars since not on insulin. She was due for 6 month labs and never came in for these. Have had long discussion with family as well about her poor compliance/memory concern and lack of follow up with me and neurology. Please put her in for appointment on Friday or Monday.   Thanks Dr. Rogers Blocker

## 2019-07-24 NOTE — Telephone Encounter (Signed)
Pt. Called and stated she was returning a call to reschedule an appt.  Reported that she has been "feeling under the weather."  Questioned about symptoms.  Pt. Reported her urine is "bubbly."  Reported frequent urination for about past 2 weeks.  Pt. Denied fever/ chills, or any burning with urination.  C/o headache.  Reported she is diabetic and has not been checking her blood sugar for the past year.  Pt. questioned if her blood sugar might be elevated. Reported she takes Metformin once / day, and has been taking it regularly.  C/o headache and has ringing in her ears.  Denied dizziness.  Admitted to having intermittent left chest pain "above breast that lasts only for a few seconds".  Denied shortness of breath, sweating or nausea, at time of chest pain.  Stated she does perspire at times, but denied that it was associated with chest pain.  Attempted to call office to get pt. Rescheduled for a 6 mo. F/u appt.  Unable to reach a Scheduler at the office at this time.  Advised pt. Will send Triage note to office, and someone will call her to reschedule.  Care advice given to increase oral fluids, get battery changed in glucose meter, and monitor blood sugars twice/ day.  Also advised to monitor for worsening chest pain and go to ER if increase in chest pain that last > 5 min, with shortness of breath, sweating or nausea.  Pt. Verb. Understanding.  Agreed with plan.     Reason for Disposition . [1] Symptoms of high blood sugar (e.g., frequent urination, weak, weight loss) AND [2] not able to test blood glucose  Answer Assessment - Initial Assessment Questions 1. BLOOD GLUCOSE: "What is your blood glucose level?"      Unable to check due to dead battery  2. ONSET: "When did you check the blood glucose?"    Its been over a year 3. USUAL RANGE: "What is your glucose level usually?" (e.g., usual fasting morning value, usual evening value)     Unsure  4. KETONES: "Do you check for ketones (urine or blood  test strips)?" If yes, ask: "What does the test show now?"      *No Answer* 5. TYPE 1 or 2:  "Do you know what type of diabetes you have?"  (e.g., Type 1, Type 2, Gestational; doesn't know)      Type 2  6. INSULIN: "Do you take insulin?" "What type of insulin(s) do you use? What is the mode of delivery? (syringe, pen; injection or pump)?"      No insulin  7. DIABETES PILLS: "Do you take any pills for your diabetes?" If yes, ask: "Have you missed taking any pills recently?"    Metformin daily; no missed doses  8. OTHER SYMPTOMS: "Do you have any symptoms?" (e.g., fever, frequent urination, difficulty breathing, dizziness, weakness, vomiting)    Ringing in ears, frequent urination, bubbles in urine for 2 weeks, denied fever/ chills , denied burning with urination , denied dizziness or shortness of breath. C/o intermittent left chest pain that "lasts only seconds", above the breast, some perspiring at times that is not assoc. With chest pain, c/o headache  9. PREGNANCY: "Is there any chance you are pregnant?" "When was your last menstrual period?"    N/a  Protocols used: DIABETES - HIGH BLOOD SUGAR-A-AH

## 2019-07-25 NOTE — Telephone Encounter (Signed)
Left a voicemail message for patient to return call regarding triage note from 10/14.

## 2019-08-01 ENCOUNTER — Ambulatory Visit: Payer: BLUE CROSS/BLUE SHIELD | Admitting: Family Medicine

## 2019-09-13 ENCOUNTER — Telehealth: Payer: Self-pay | Admitting: Family Medicine

## 2019-09-13 NOTE — Telephone Encounter (Signed)
[place] [facility] called for patient as she has not returned our call regarding her concerns from October. Known memory loss with loss of f/u to neurology.  I placed a social work referral back then; however, this has not been done either due to lack of resources in [place] per referral coordinator. Patient has had no follow up for memory issues and we have spent countless amounts of time on phone with her reminding her of missed neurology appointments for memory loss, follow up for diabetes etc. I am concerned for her wellbeing with poor memory and lack of follow up with medical conditions; therefore, APS in [place] was called.   Intake was given and patient will be checked on.   Orma Flaming, MD [facility]

## 2019-10-23 ENCOUNTER — Telehealth: Payer: Self-pay | Admitting: Family Medicine

## 2019-10-23 NOTE — Telephone Encounter (Signed)
Patient C/O right lower abdominal pain x 2 weeks.She was seen for this before.Patient was scheduled for a MRI but could not go through with it due to anxiety. Offered appt pt declined,she stated she wants to know first if she needs to just get the MRI or appt. Please advise

## 2019-10-23 NOTE — Telephone Encounter (Signed)
Patient calls in saying she is still having the right sided abdominal pain, wanted an appointment for this week due to the pain being so unbearable. Had [facility] triage patient.

## 2019-10-23 NOTE — Telephone Encounter (Signed)
Spoke to patient.  She is having abdominal pain that is in the RLQ area that radiates around to her right lower back.  She denies any fever, chills, N/V/D or constipation.  She reports that the pain is a 6/10 at it's worst.  She states that nothing makes pain worse or better; she has been using "horse liniment" w/some relief of pain.    I advised patient that we have never ordered an MRI of her abdomen only of her brain; which she never went and had done.  She also never followed up with neurology that Dr. Artis Flock referred her to back in the Spring.  Patient has a h/o memory loss which is worsening ([facility] referral placed in December of 2020; still has not been followed up on due to limited resources in [place] where patient lives) and I advised that we were very concerned for her well being due to her memory loss.  Patient laughed when I told her that the MRI that we ordered was not for her abdomen but in fact for brain.    Based on her symptoms and the distance that she is from our office, I advised patient to contact a friend or neighbor and have them take her to Urgent Care for evaluation.  I urged her to do this as soon as I hung up the phone with her.  She verbalized understanding.

## 2019-10-23 NOTE — Telephone Encounter (Signed)
I have never seen her for this or ordered a MRI of her stomach. She has had poor follow up for everything.   We need to actually see if any urgent signs like N/V/, blood in stool, etc to make sure no ER trip needed. Otherwise I can not see her until next week or after as im really full.   Orland Mustard, MD [facility]

## 2019-10-24 NOTE — Telephone Encounter (Signed)
Agree with plan.  Jamyron Redd, MD [facility]   

## 2019-10-26 ENCOUNTER — Other Ambulatory Visit: Payer: Self-pay | Admitting: Family Medicine

## 2019-11-08 ENCOUNTER — Other Ambulatory Visit: Payer: Self-pay

## 2019-11-08 ENCOUNTER — Emergency Department (HOSPITAL_COMMUNITY)
Admission: EM | Admit: 2019-11-08 | Discharge: 2019-11-08 | Disposition: A | Discharge status: Home / Self Care | Payer: Medicare Other | Attending: Emergency Medicine | Admitting: Emergency Medicine

## 2019-11-08 ENCOUNTER — Emergency Department (HOSPITAL_COMMUNITY): Payer: Medicare Other

## 2019-11-08 DIAGNOSIS — Z79899 Other long term (current) drug therapy: Secondary | ICD-10-CM | POA: Diagnosis not present

## 2019-11-08 DIAGNOSIS — R109 Unspecified abdominal pain: Secondary | ICD-10-CM | POA: Diagnosis present

## 2019-11-08 DIAGNOSIS — Z7984 Long term (current) use of oral hypoglycemic drugs: Secondary | ICD-10-CM | POA: Diagnosis not present

## 2019-11-08 DIAGNOSIS — Z87891 Personal history of nicotine dependence: Secondary | ICD-10-CM | POA: Insufficient documentation

## 2019-11-08 DIAGNOSIS — I10 Essential (primary) hypertension: Secondary | ICD-10-CM | POA: Diagnosis not present

## 2019-11-08 DIAGNOSIS — Z96652 Presence of left artificial knee joint: Secondary | ICD-10-CM | POA: Diagnosis not present

## 2019-11-08 DIAGNOSIS — E119 Type 2 diabetes mellitus without complications: Secondary | ICD-10-CM | POA: Diagnosis not present

## 2019-11-08 LAB — CBC WITH DIFFERENTIAL/PLATELET
Abs Immature Granulocytes: 0.04 10*3/uL (ref 0.00–0.07)
Basophils Absolute: 0 10*3/uL (ref 0.0–0.1)
Basophils Relative: 0 %
Eosinophils Absolute: 0.2 10*3/uL (ref 0.0–0.5)
Eosinophils Relative: 2 %
HCT: 38 % (ref 36.0–46.0)
Hemoglobin: 12.2 g/dL (ref 12.0–15.0)
Immature Granulocytes: 0 %
Lymphocytes Relative: 28 %
Lymphs Abs: 3.2 10*3/uL (ref 0.7–4.0)
MCH: 31.4 pg (ref 26.0–34.0)
MCHC: 32.1 g/dL (ref 30.0–36.0)
MCV: 97.9 fL (ref 80.0–100.0)
Monocytes Absolute: 0.9 10*3/uL (ref 0.1–1.0)
Monocytes Relative: 8 %
Neutro Abs: 6.9 10*3/uL (ref 1.7–7.7)
Neutrophils Relative %: 62 %
Platelets: 334 10*3/uL (ref 150–400)
RBC: 3.88 MIL/uL (ref 3.87–5.11)
RDW: 14.9 % (ref 11.5–15.5)
WBC: 11.3 10*3/uL — ABNORMAL HIGH (ref 4.0–10.5)
nRBC: 0 % (ref 0.0–0.2)

## 2019-11-08 LAB — URINALYSIS, ROUTINE W REFLEX MICROSCOPIC
Bilirubin Urine: NEGATIVE
Glucose, UA: NEGATIVE mg/dL
Ketones, ur: NEGATIVE mg/dL
Leukocytes,Ua: NEGATIVE
Nitrite: NEGATIVE
Protein, ur: NEGATIVE mg/dL
Specific Gravity, Urine: 1.024 (ref 1.005–1.030)
pH: 5 (ref 5.0–8.0)

## 2019-11-08 LAB — COMPREHENSIVE METABOLIC PANEL
ALT: 18 U/L (ref 0–44)
AST: 18 U/L (ref 15–41)
Albumin: 4.2 g/dL (ref 3.5–5.0)
Alkaline Phosphatase: 80 U/L (ref 38–126)
Anion gap: 9 (ref 5–15)
BUN: 19 mg/dL (ref 8–23)
CO2: 25 mmol/L (ref 22–32)
Calcium: 9.1 mg/dL (ref 8.9–10.3)
Chloride: 105 mmol/L (ref 98–111)
Creatinine, Ser: 0.87 mg/dL (ref 0.44–1.00)
GFR calc Af Amer: >60 mL/min (ref >60)
GFR calc non Af Amer: >60 mL/min (ref >60)
Glucose, Bld: 101 mg/dL — ABNORMAL HIGH (ref 70–99)
Potassium: 3.9 mmol/L (ref 3.5–5.1)
Sodium: 139 mmol/L (ref 135–145)
Total Bilirubin: 0.7 mg/dL (ref 0.3–1.2)
Total Protein: 7.4 g/dL (ref 6.5–8.1)

## 2019-11-08 LAB — LIPASE, BLOOD: Lipase: 26 U/L (ref 11–51)

## 2019-11-08 MED ORDER — HYDROMORPHONE HCL 1 MG/ML IJ SOLN: 1.0000 mg | Freq: Once | INTRAMUSCULAR | Status: DC

## 2019-11-08 MED ORDER — HYDROMORPHONE HCL 1 MG/ML IJ SOLN
1.0000 mg | Freq: Once | INTRAMUSCULAR | Status: AC
  Administered 2019-11-08: 1 mg via INTRAVENOUS
  Filled 2019-11-08: qty 1

## 2019-11-08 NOTE — ED Triage Notes (Signed)
Pt states that she has had R sided flank pain x several months. Constant. Denies urinary symptoms. Alert and oriented.

## 2019-11-08 NOTE — Discharge Instructions (Signed)
Recommend Tylenol, Motrin as needed for pain control.  Recommend follow-up with primary doctor regarding symptoms you are experiencing today.  Return to ER if you develop fever, worsening pain, vomiting or other new concerning symptom.

## 2019-11-08 NOTE — ED Provider Notes (Signed)
[facility]-EMERGENCY DEPT Provider Note   CSN: [id number] Arrival date & time: 11/08/19  1456     History Chief Complaint  Patient presents with  . Flank Pain    Brandi Fitzgerald is a 64 y.o. female.  Presented to ER with complaint of right flank pain.  Patient reports symptoms for the last 3 or 4 months.  Constant, daily, no alleviating or aggravating factors.  No nausea, vomiting, diarrhea or constipation.  Had normal bowel movement yesterday.  No blood.  No blood in urine, no dysuria.  Denies any falls or heavy lifting recently.  Past medical history fibromyalgia, rheumatoid arthritis, diabetes, hypertension.  HPI     Past Medical History:  Diagnosis Date  . Anxiety   . Arthritis   . Diabetes mellitus   . Diverticulitis   . Diverticulosis   . High cholesterol   . Hypertension     Patient Active Problem List   Diagnosis Date Noted  . Fibromyalgia 08/16/2018  . Rheumatoid arthritis (HCC) 05/14/2018  . Diabetes mellitus without complication (HCC) 05/14/2018  . Essential hypertension 05/14/2018  . Anxiety 05/14/2018  . Hyperlipidemia 05/14/2018  . Insomnia 05/14/2018  . Herpes genitalia 05/14/2018  . Obese 02/19/2014  . S/P left TKA 02/18/2014    Past Surgical History:  Procedure Laterality Date  . ABDOMINAL HYSTERECTOMY    . BUNIONECTOMY     left foot- twice  . EYE SURGERY     had sand in eye  . FOOT ARTHROPLASTY     Bil  . 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 WRIST ARTHROSCOPY     right     OB History   No obstetric history on file.     Family History  Problem Relation Age of Onset  . Heart failure Mother   . Hypertension Mother   . Hyperlipidemia Mother   . Arthritis Mother   . Alzheimer's disease Mother        late 35's  . Heart disease Father   . Alcohol abuse Father   . Arthritis  Father   . Arthritis Sister   . Diabetes Sister   . Hyperlipidemia Sister   . Hypertension Sister   . Memory loss Sister   . Memory loss Brother   . Drug abuse Brother   . Heart disease Brother   . Arthritis Sister   . Memory loss Sister   . Alzheimer's disease Maternal Uncle     Social History   Tobacco Use  . Smoking status: Former Smoker    Quit date: 01/28/1999    Years since quitting: 20.7  . Smokeless tobacco: Never Used  Substance Use Topics  . Alcohol use: Yes    Alcohol/week: 2.0 standard drinks    Types: 2 Standard drinks or equivalent per week    Comment: 2 drinks/week  . Drug use: No    Home Medications Prior to Admission medications   Medication Sig Start Date End Date Taking? Authorizing Provider  donepezil (ARICEPT) 5 MG tablet TAKE 1 TABLET BY MOUTH AT  BEDTIME 06/04/19   Orland Mustard, MD  HYDROcodone-acetaminophen [facility]) 7.5-325 MG per tablet Take 1-2 tablets by mouth every 4 (four) hours. Patient taking differently: Take 1-2 tablets by mouth 4 (four) times daily as needed.  02/20/14   Lanney Gins, PA-C  ipratropium (ATROVENT) 0.03 % nasal spray Place 2  sprays into both nostrils daily.    [provider]  lisinopril (ZESTRIL) 20 MG tablet TAKE 1 TABLET BY MOUTH IN  THE MORNING 10/28/19   Orma Flaming, MD  metFORMIN (GLUCOPHAGE) 500 MG tablet TAKE 1 TABLET BY MOUTH  EVERY DAY WITH BREAKFAST 01/30/19   Orma Flaming, MD  montelukast (SINGULAIR) 10 MG tablet Take 1 tablet (10 mg total) by mouth at bedtime. 01/30/19   Orma Flaming, MD  ORENCIA 125 MG/ML SOSY Inject 125 mg into the muscle once a week. 06/14/17   [provider]  predniSONE (DELTASONE) 5 MG tablet Take 5 mg by mouth daily with breakfast.    [provider]  rosuvastatin (CRESTOR) 20 MG tablet Take 1 tablet (20 mg total) by mouth daily. 01/30/19   Orma Flaming, MD  valACYclovir (VALTREX) 500 MG tablet One tablet BID x 3 days for outbreak start within 24 hour of  symptom onset 01/16/19   Orma Flaming, MD  VITAMIN D, CHOLECALCIFEROL, PO Take by mouth. Vit d 1.25 mg weekly    [provider]    Allergies    Januvia [sitagliptin phosphate], Aspirin, Cephalosporins, and Penicillins  Review of Systems   Review of Systems  Constitutional: Negative for chills and fever.  HENT: Negative for ear pain and sore throat.   Eyes: Negative for pain and visual disturbance.  Respiratory: Negative for cough and shortness of breath.   Cardiovascular: Negative for chest pain and palpitations.  Gastrointestinal: Positive for abdominal pain. Negative for vomiting.  Genitourinary: Negative for dysuria and hematuria.  Musculoskeletal: Negative for arthralgias and back pain.  Skin: Negative for color change and rash.  Neurological: Negative for seizures and syncope.  All other systems reviewed and are negative.   Physical Exam Updated Vital Signs BP 131/90 (BP Location: Right Arm)   Pulse 72   Temp 98.3 F (36.8 C) (Oral)   Resp 18   Ht 5\' 7"  (1.702 m)   Wt 93.4 kg   LMP  (LMP Unknown)   SpO2 99%   BMI 32.26 kg/m   Physical Exam Vitals and nursing note reviewed.  Constitutional:      General: She is not in acute distress.    Appearance: She is well-developed.  HENT:     Head: Normocephalic and atraumatic.  Eyes:     Conjunctiva/sclera: Conjunctivae normal.  Cardiovascular:     Rate and Rhythm: Normal rate and regular rhythm.     Heart sounds: No murmur.  Pulmonary:     Effort: Pulmonary effort is normal. No respiratory distress.     Breath sounds: Normal breath sounds.  Abdominal:     Palpations: Abdomen is soft.     Tenderness: There is no abdominal tenderness.     Comments: No tenderness palpation throughout abdomen, there is some tenderness in her right flank region, no rebound or guarding  Musculoskeletal:        General: No tenderness or deformity.     Cervical back: Neck supple.  Skin:    General: Skin is warm and dry.    Neurological:     General: No focal deficit present.     Mental Status: She is alert and oriented to person, place, and time.  Psychiatric:        Mood and Affect: Mood normal.        Behavior: Behavior normal.     ED Results / Procedures / Treatments   Labs (all labs ordered are listed, but only abnormal results are displayed)  Labs Reviewed  URINALYSIS, ROUTINE W REFLEX MICROSCOPIC - Abnormal; Notable for the following components:      Result Value   Hgb urine dipstick SMALL (*)    Bacteria, UA RARE (*)    All other components within normal limits  CBC WITH DIFFERENTIAL/PLATELET - Abnormal; Notable for the following components:   WBC 11.3 (*)    All other components within normal limits  COMPREHENSIVE METABOLIC PANEL  LIPASE, BLOOD    EKG None  Radiology No results found.  Procedures Procedures (including critical care time)  Medications Ordered in ED Medications  HYDROmorphone (DILAUDID) injection 1 mg (has no administration in time range)    ED Course  I have reviewed the triage vital signs and the nursing notes.  Pertinent labs & imaging results that were available during my care of the patient were reviewed by me and considered in my medical decision making (see chart for details).    MDM Rules/Calculators/A&P                      65 year old lady presented to ER with months of right flank pain.  On exam patient is noted to be well-appearing, she has normal vital signs, no fever.  There was a mild amount of tenderness on her right flank.  Labs were within normal limits.  No urinary tract infection.  CT scan was negative for acute abdominal pelvic pathology.  Based on chronicity, a forementioned findings, higher suspicion for MSK strain.  Recommend follow-up with primary doctor, NSAIDs and Tylenol as needed pain control.  Return to ER as needed for worsening symptoms.    After the discussed management above, the patient was determined to be safe for  discharge.  The patient was in agreement with this plan and all questions regarding their care were answered.  ED return precautions were discussed and the patient will return to the ED with any significant worsening of condition.   Final Clinical Impression(s) / ED Diagnoses Final diagnoses:  Flank pain    Rx / DC Orders ED Discharge Orders    None       Milagros Loll, MD 11/08/19 [phone]

## 2019-11-27 ENCOUNTER — Other Ambulatory Visit: Payer: Self-pay | Admitting: Family Medicine

## 2020-01-08 ENCOUNTER — Other Ambulatory Visit: Payer: Self-pay | Admitting: Family Medicine

## 2020-01-20 ENCOUNTER — Other Ambulatory Visit: Payer: Self-pay | Admitting: Family Medicine

## 2020-01-28 ENCOUNTER — Ambulatory Visit: Payer: BLUE CROSS/BLUE SHIELD | Admitting: Family Medicine

## 2020-01-29 ENCOUNTER — Ambulatory Visit: Payer: BLUE CROSS/BLUE SHIELD | Admitting: Family Medicine

## 2020-02-13 ENCOUNTER — Other Ambulatory Visit: Payer: Self-pay

## 2020-02-13 ENCOUNTER — Emergency Department (HOSPITAL_COMMUNITY): Payer: Medicare Other

## 2020-02-13 ENCOUNTER — Emergency Department (HOSPITAL_COMMUNITY)
Admission: EM | Admit: 2020-02-13 | Discharge: 2020-02-13 | Disposition: A | Discharge status: Home / Self Care | Payer: Medicare Other | Attending: Emergency Medicine | Admitting: Emergency Medicine

## 2020-02-13 ENCOUNTER — Encounter (HOSPITAL_COMMUNITY): Payer: Self-pay | Admitting: Emergency Medicine

## 2020-02-13 DIAGNOSIS — I10 Essential (primary) hypertension: Secondary | ICD-10-CM | POA: Diagnosis not present

## 2020-02-13 DIAGNOSIS — Z96652 Presence of left artificial knee joint: Secondary | ICD-10-CM | POA: Insufficient documentation

## 2020-02-13 DIAGNOSIS — Z7984 Long term (current) use of oral hypoglycemic drugs: Secondary | ICD-10-CM | POA: Diagnosis not present

## 2020-02-13 DIAGNOSIS — Z87891 Personal history of nicotine dependence: Secondary | ICD-10-CM | POA: Diagnosis not present

## 2020-02-13 DIAGNOSIS — E119 Type 2 diabetes mellitus without complications: Secondary | ICD-10-CM | POA: Diagnosis not present

## 2020-02-13 DIAGNOSIS — R232 Flushing: Secondary | ICD-10-CM | POA: Diagnosis not present

## 2020-02-13 DIAGNOSIS — R072 Precordial pain: Secondary | ICD-10-CM | POA: Diagnosis not present

## 2020-02-13 DIAGNOSIS — Z79899 Other long term (current) drug therapy: Secondary | ICD-10-CM | POA: Diagnosis not present

## 2020-02-13 LAB — CBC
HCT: 37.6 % (ref 36.0–46.0)
Hemoglobin: 12 g/dL (ref 12.0–15.0)
MCH: 31.7 pg (ref 26.0–34.0)
MCHC: 31.9 g/dL (ref 30.0–36.0)
MCV: 99.2 fL (ref 80.0–100.0)
Platelets: 335 10*3/uL (ref 150–400)
RBC: 3.79 MIL/uL — ABNORMAL LOW (ref 3.87–5.11)
RDW: 14.6 % (ref 11.5–15.5)
WBC: 9.3 10*3/uL (ref 4.0–10.5)
nRBC: 0 % (ref 0.0–0.2)

## 2020-02-13 LAB — BASIC METABOLIC PANEL
Anion gap: 9 (ref 5–15)
BUN: 11 mg/dL (ref 8–23)
CO2: 29 mmol/L (ref 22–32)
Calcium: 9.1 mg/dL (ref 8.9–10.3)
Chloride: 104 mmol/L (ref 98–111)
Creatinine, Ser: 1.07 mg/dL — ABNORMAL HIGH (ref 0.44–1.00)
GFR calc Af Amer: >60 mL/min (ref >60)
GFR calc non Af Amer: 54 mL/min — ABNORMAL LOW (ref >60)
Glucose, Bld: 192 mg/dL — ABNORMAL HIGH (ref 70–99)
Potassium: 4.2 mmol/L (ref 3.5–5.1)
Sodium: 142 mmol/L (ref 135–145)

## 2020-02-13 LAB — TROPONIN I (HIGH SENSITIVITY)
Troponin I (High Sensitivity): 4 ng/L (ref <18)
Troponin I (High Sensitivity): 4 ng/L (ref <18)

## 2020-02-13 MED ORDER — SODIUM CHLORIDE 0.9% FLUSH: 3.0000 mL | Freq: Once | INTRAVENOUS | Status: DC

## 2020-02-13 NOTE — ED Triage Notes (Signed)
Pt reports L chest pain with radiation to L arm and dizziness x2 days. Denies sob. Resp e/u, nad.

## 2020-02-13 NOTE — Discharge Instructions (Signed)
Please read and follow all provided instructions.  Your diagnoses today include:  1. Precordial pain     Tests performed today include:  An EKG of your heart - no sign of stress on the heart  A chest x-ray   Cardiac enzymes - a blood test for heart muscle damage  Blood counts and electrolytes  Vital signs. See below for your results today.   Medications prescribed:   None  Take any prescribed medications only as directed.  Follow-up instructions: Please follow-up with your primary care provider as soon as you can for further evaluation of your symptoms.   Return instructions:  SEEK IMMEDIATE MEDICAL ATTENTION IF:  You have severe chest pain, especially if the pain is crushing or pressure-like and spreads to the arms, back, neck, or jaw, or if you have sweating, nausea (feeling sick to your stomach), or shortness of breath. THIS IS AN EMERGENCY. Don't wait to see if the pain will go away. Get medical help at once. Call 911 or 0 (operator). DO NOT drive yourself to the hospital.   Your chest pain gets worse and does not go away with rest.   You have an attack of chest pain lasting longer than usual, despite rest and treatment with the medications your caregiver has prescribed.   You wake from sleep with chest pain or shortness of breath.  You feel dizzy or faint.  You have chest pain not typical of your usual pain for which you originally saw your caregiver.   You have any other emergent concerns regarding your health.  Additional Information: Chest pain comes from many different causes. Your caregiver has diagnosed you as having chest pain that is not specific for one problem, but does not require admission.  You are at low risk for an acute heart condition or other serious illness.   Your vital signs today were: BP 137/86    Pulse 61    Temp 98.4 F (36.9 C) (Oral)    Resp 15    LMP  (LMP Unknown)    SpO2 98%  If your blood pressure (BP) was elevated above 135/85  this visit, please have this repeated by your doctor within one month. --------------

## 2020-02-13 NOTE — ED Provider Notes (Signed)
[facility] EMERGENCY DEPARTMENT Provider Note   CSN: [id number] Arrival date & time: 02/13/20  1012     History Chief Complaint  Patient presents with  . Chest Pain    Brandi Fitzgerald is a 64 y.o. female.  Patient presents to the emergency department today for chest pain.  Her risk factor profile includes diabetes, hypertension, high cholesterol.  She does not use tobacco.  She is not able to tell me her family history.  She states that since yesterday she has had episodes of left parasternal pain that is transient in nature lasting several minutes at a time.  Is associated with hot flashes but no diaphoresis.  She has not had any nausea or vomiting.  Pain radiates to her right arm at times.  She is not able to describe the pain well.  No cough or fever.  No lightheadedness or syncope.  She denies any risk factors for pulmonary embolism and denies any shortness of breath. Patient denies risk factors for pulmonary embolism including: unilateral leg swelling, history of DVT/PE/other blood clots, use of exogenous hormones, recent immobilizations, recent surgery, recent travel (>4hr segment), malignancy, hemoptysis.  Movement of the arms and eating/drinking does not seem to make the symptoms worse.  Patient reports not taking her medications this morning due to feeling unwell.         Past Medical History:  Diagnosis Date  . Anxiety   . Arthritis   . Diabetes mellitus   . Diverticulitis   . Diverticulosis   . High cholesterol   . Hypertension     Patient Active Problem List   Diagnosis Date Noted  . Fibromyalgia 08/16/2018  . Rheumatoid arthritis ([place]) 05/14/2018  . Diabetes mellitus without complication ([address]) 02/63/7858  . Essential hypertension 05/14/2018  . Anxiety 05/14/2018  . Hyperlipidemia 05/14/2018  . Insomnia 05/14/2018  . Herpes genitalia 05/14/2018  . Obese 02/19/2014  . S/P left TKA 02/18/2014    Past Surgical History:  Procedure Laterality  Date  . ABDOMINAL HYSTERECTOMY    . BUNIONECTOMY     left foot- twice  . EYE SURGERY     had sand in eye  . FOOT ARTHROPLASTY     Bil  . TOTAL KNEE ARTHROPLASTY Left 02/18/2014   Procedure: LEFT TOTAL KNEE ARTHROPLASTY;  Surgeon: Mauri Pole, MD;  Location: [facility];  Service: Orthopedics;  Laterality: Left;  . WRIST ARTHROSCOPY     right     OB History   No obstetric history on file.     Family History  Problem Relation Age of Onset  . Heart failure Mother   . Hypertension Mother   . Hyperlipidemia Mother   . Arthritis Mother   . Alzheimer's disease Mother        late 22's  . Heart disease Father   . Alcohol abuse Father   . Arthritis Father   . Arthritis Sister   . Diabetes Sister   . Hyperlipidemia Sister   . Hypertension Sister   . Memory loss Sister   . Memory loss Brother   . Drug abuse Brother   . Heart disease Brother   . Arthritis Sister   . Memory loss Sister   . Alzheimer's disease Maternal Uncle     Social History   Tobacco Use  . Smoking status: Former Smoker    Quit date: 01/28/1999    Years since quitting: 21.0  . Smokeless tobacco: Never Used  Substance Use Topics  . Alcohol  use: Yes    Alcohol/week: 2.0 standard drinks    Types: 2 Standard drinks or equivalent per week    Comment: 2 drinks/week  . Drug use: No    Home Medications Prior to Admission medications   Medication Sig Start Date End Date Taking? Authorizing Provider  donepezil (ARICEPT) 5 MG tablet TAKE 1 TABLET BY MOUTH AT  BEDTIME 06/04/19   Orland Mustard, MD  HYDROcodone-acetaminophen [facility]) 7.5-325 MG per tablet Take 1-2 tablets by mouth every 4 (four) hours. Patient taking differently: Take 1-2 tablets by mouth 4 (four) times daily as needed.  02/20/14   Lanney Gins, PA-C  ipratropium (ATROVENT) 0.03 % nasal spray Place 2 sprays into both nostrils daily.    [provider]  lisinopril (ZESTRIL) 20 MG tablet TAKE 1 TABLET BY MOUTH IN  THE MORNING 11/28/19    Orland Mustard, MD  metFORMIN (GLUCOPHAGE) 500 MG tablet TAKE 1 TABLET BY MOUTH  DAILY WITH BREAKFAST 01/09/20   Orland Mustard, MD  montelukast (SINGULAIR) 10 MG tablet Take 1 tablet (10 mg total) by mouth at bedtime. Please schedule a follow up appt with Dr. Artis Flock for further refills [phone] 01/21/20   Orland Mustard, MD  ORENCIA 125 MG/ML SOSY Inject 125 mg into the muscle once a week. 06/14/17   [provider]  predniSONE (DELTASONE) 5 MG tablet Take 5 mg by mouth daily with breakfast.    [provider]  rosuvastatin (CRESTOR) 20 MG tablet TAKE 1 TABLET BY MOUTH  DAILY 01/09/20   Orland Mustard, MD  valACYclovir (VALTREX) 500 MG tablet One tablet BID x 3 days for outbreak start within 24 hour of symptom onset 01/16/19   Orland Mustard, MD  VITAMIN D, CHOLECALCIFEROL, PO Take by mouth. Vit d 1.25 mg weekly    [provider]    Allergies    Januvia [sitagliptin phosphate], Aspirin, Cephalosporins, and Penicillins  Review of Systems   Review of Systems  Constitutional: Negative for diaphoresis and fever.  Eyes: Negative for redness.  Respiratory: Negative for cough and shortness of breath.   Cardiovascular: Positive for chest pain. Negative for palpitations and leg swelling.  Gastrointestinal: Negative for abdominal pain, nausea and vomiting.  Endocrine: Positive for heat intolerance.  Genitourinary: Negative for dysuria.  Musculoskeletal: Negative for back pain and neck pain.  Skin: Negative for rash.  Neurological: Negative for syncope and light-headedness.  Psychiatric/Behavioral: The patient is not nervous/anxious.     Physical Exam Updated Vital Signs BP 131/86 (BP Location: Right Arm)   Pulse 78   Temp 98.4 F (36.9 C) (Oral)   Resp 16   LMP  (LMP Unknown)   SpO2 97%   Physical Exam Vitals and nursing note reviewed.  Constitutional:      Appearance: She is well-developed. She is not diaphoretic.  HENT:     Head: Normocephalic and  atraumatic.     Mouth/Throat:     Mouth: Mucous membranes are not dry.  Eyes:     Conjunctiva/sclera: Conjunctivae normal.  Neck:     Vascular: Normal carotid pulses. No carotid bruit or JVD.     Trachea: Trachea normal. No tracheal deviation.  Cardiovascular:     Rate and Rhythm: Normal rate and regular rhythm.     Pulses: No decreased pulses.     Heart sounds: Normal heart sounds, S1 normal and S2 normal. No murmur.  Pulmonary:     Effort: Pulmonary effort is normal. No respiratory distress.     Breath sounds:  No wheezing.  Chest:     Chest wall: No tenderness.  Abdominal:     General: Bowel sounds are normal.     Palpations: Abdomen is soft.     Tenderness: There is no abdominal tenderness. There is no guarding or rebound.  Musculoskeletal:        General: Normal range of motion.     Cervical back: Normal range of motion and neck supple. No muscular tenderness.  Skin:    General: Skin is warm and dry.     Coloration: Skin is not pale.  Neurological:     Mental Status: She is alert.     ED Results / Procedures / Treatments   Labs (all labs ordered are listed, but only abnormal results are displayed) Labs Reviewed  BASIC METABOLIC PANEL - Abnormal; Notable for the following components:      Result Value   Glucose, Bld 192 (*)    Creatinine, Ser 1.07 (*)    GFR calc non Af Amer 54 (*)    All other components within normal limits  CBC - Abnormal; Notable for the following components:   RBC 3.79 (*)    All other components within normal limits  TROPONIN I (HIGH SENSITIVITY)  TROPONIN I (HIGH SENSITIVITY)    EKG EKG Interpretation  Date/Time:  Thursday Feb 13 2020 10:13:18 EDT Ventricular Rate:  79 PR Interval:  148 QRS Duration: 82 QT Interval:  360 QTC Calculation: 412 R Axis:   94 Text Interpretation: Normal sinus rhythm Rightward axis Low voltage QRS Borderline ECG no acute ST/T changes No old tracing to compare Confirmed by Pricilla Loveless [phone]) on  02/13/2020 12:45:31 PM   Radiology DG Chest 2 View  Result Date: 02/13/2020 CLINICAL DATA:  Left chest pain EXAM: CHEST - 2 VIEW COMPARISON:  02/05/2014 FINDINGS: The heart size and mediastinal contours are within normal limits. Both lungs are clear. The visualized skeletal structures are unremarkable. IMPRESSION: Normal study. Electronically Signed   By: Charlett Nose M.D.   On: 02/13/2020 10:40    Procedures Procedures (including critical care time)  Medications Ordered in ED Medications  sodium chloride flush (NS) 0.9 % injection 3 mL (has no administration in time range)    ED Course  I have reviewed the triage vital signs and the nursing notes.  Pertinent labs & imaging results that were available during my care of the patient were reviewed by me and considered in my medical decision making (see chart for details).  Patient seen and examined. EKG reviewed.  First troponin is 4.  Second pending.  Vital signs reviewed and are as follows: BP 131/86 (BP Location: Right Arm)   Pulse 78   Temp 98.4 F (36.9 C) (Oral)   Resp 16   LMP  (LMP Unknown)   SpO2 97%   Patient discussed with Dr. Criss Alvine who will see.  2:55 PM 2nd troponin resulted 4 >> 4.    Pt updated on results.  She is comfortable without any symptoms in the current time.  Heart score equals 4 we discussed that she has moderate risk given her risk factor profile.  Discussed with patient that at this point given her normal troponins and EKG, atypical symptoms, after discussion by Dr. Criss Alvine and myself, we do not feel strongly that she needs to be admitted to the hospital for this.  We feel that she can likely follow-up with her primary care doctor for further evaluation, if she is willing to monitor her symptoms closely  and return with any changes or worsening.  Patient verbalizes agreement with this plan and would prefer to go home.  We discussed that it can be very difficult determining the exact etiology of chest  pain and that even with the testing here, she still have pain related to her heart.  We discussed need for very close monitoring at home and follow-up with her PCP.  We discussed that she may need a stress test at some point in the future given risk factor profile especially if symptoms are recurrent.  Will defer this decision to PCP.  We also discussed the importance of returning to the nearest emergency department with persistent or changing chest pain symptoms.  Patient verbalizes understanding agrees with plan.   MDM Rules/Calculators/A&P                      Patient presents today with ongoing chest pain over the past several days.  This has been intermittent.  It is nonexertional.  She does have radiation to her left arm.  No diaphoresis or vomiting.  EKG is non-ischemic, troponin neg x 2. CXR is clear. No features concerning for PE. Pt discussed with and seen by Dr. Criss Alvine. We agree that that looks well enough to go home with close PCP f/u and strict return instructions.  Patient is comfortable with this plan.  She seems reliable to return if something changes.    Final Clinical Impression(s) / ED Diagnoses Final diagnoses:  Precordial pain    Rx / DC Orders ED Discharge Orders    None       Renne Crigler, PA-C 02/13/20 1506    Pricilla Loveless, MD 02/17/20 [phone]

## 2020-02-17 ENCOUNTER — Telehealth: Payer: Self-pay | Admitting: Family Medicine

## 2020-02-17 NOTE — Telephone Encounter (Signed)
Please schedule pt on 02/19/2020 at 2:20 for side pain if patient returns call.

## 2020-02-17 NOTE — Telephone Encounter (Signed)
Nurse Assessment Nurse: Scarlette Ar, RN, Heather Date/Time (Eastern Time): 02/17/2020 2:31:23 PM Confirm and document reason for call. If symptomatic, describe symptoms. ---Caller states that she has been having sharp right side abd pain for the last 3-4 weeks. She has an abd mesh for a previous hernia that she has had for a couple of years Has the patient had close contact with a person known or suspected to have the novel coronavirus illness OR traveled / lives in area with major community spread (including international travel) in the last 14 days from the onset of symptoms? * If Asymptomatic, screen for exposure and travel within the last 14 days. ---No Does the patient have any new or worsening symptoms? ---Yes Will a triage be completed? ---Yes Related visit to physician within the last 2 weeks? ---No Does the PT have any chronic conditions? (i.e. diabetes, asthma, this includes High risk factors for pregnancy, etc.) ---Yes List chronic conditions. ---memory problems, Is this a behavioral health or substance abuse call? ---No Guidelines Guideline Title Affirmed Question Affirmed Notes Nurse Date/Time (Eastern Time) Abdominal Pain - Female [1] MODERATE pain (e.g., interferes with normal activities) AND [2] pain comes and goes (cramps) AND [3] present Standifer, RN, Herbert Seta 02/17/2020 2:33:32 PMPLEASE NOTE: All timestamps contained within this report are represented as [place] Standard Time. CONFIDENTIALTY NOTICE: This fax transmission is intended only for the addressee. It contains information that is legally privileged, confidential or otherwise protected from use or disclosure. If you are not the intended recipient, you are strictly prohibited from reviewing, disclosing, copying using or disseminating any of this information or taking any action in reliance on or regarding this information. If you have received this fax in error, please notify us immediately by telephone so that we  can arrange for its return to [place]. Phone: [phone], Toll-Free: [phone], Fax: [phone] Page: 2 of 2 Call Id: [id number] Guidelines Guideline Title Affirmed Question Affirmed Notes Nurse Date/Time Lamount Cohen Time) > 24 hours (Exception: pain with Vomiting or Diarrhea - see that Guideline) Disp. Time Lamount Cohen Time) Disposition Final User 02/17/2020 2:29:30 PM Send to Urgent Queue Terrilee Files 02/17/2020 2:38:37 PM See PCP within 24 Hours Yes Standifer, RN, Herbert Seta Caller Disagree/Comply Comply Caller Understands Yes PreDisposition Call Doctor Care Advice Given Per Guideline SEE PCP WITHIN 24 HOURS: CARE ADVICE given per Abdominal Pain, Female (Adult) guideline. CALL BACK IF: * You become worse

## 2020-04-10 ENCOUNTER — Other Ambulatory Visit: Payer: Self-pay | Admitting: Family Medicine

## 2020-04-11 ENCOUNTER — Other Ambulatory Visit: Payer: Self-pay | Admitting: Family Medicine

## 2020-04-21 ENCOUNTER — Other Ambulatory Visit: Payer: Self-pay | Admitting: Family Medicine

## 2020-05-07 ENCOUNTER — Other Ambulatory Visit: Payer: Self-pay | Admitting: Family Medicine

## 2020-06-03 ENCOUNTER — Emergency Department (HOSPITAL_COMMUNITY)
Admission: EM | Admit: 2020-06-03 | Discharge: 2020-06-03 | Disposition: A | Discharge status: Home / Self Care | Payer: Medicare Other | Attending: Emergency Medicine | Admitting: Emergency Medicine

## 2020-06-03 ENCOUNTER — Emergency Department (HOSPITAL_COMMUNITY): Payer: Medicare Other

## 2020-06-03 ENCOUNTER — Encounter (HOSPITAL_COMMUNITY): Payer: Self-pay | Admitting: Obstetrics and Gynecology

## 2020-06-03 ENCOUNTER — Other Ambulatory Visit: Payer: Self-pay

## 2020-06-03 DIAGNOSIS — Z5321 Procedure and treatment not carried out due to patient leaving prior to being seen by health care provider: Secondary | ICD-10-CM | POA: Insufficient documentation

## 2020-06-03 DIAGNOSIS — R079 Chest pain, unspecified: Secondary | ICD-10-CM | POA: Diagnosis present

## 2020-06-03 LAB — BASIC METABOLIC PANEL
Anion gap: 11 (ref 5–15)
BUN: 16 mg/dL (ref 8–23)
CO2: 29 mmol/L (ref 22–32)
Calcium: 9.3 mg/dL (ref 8.9–10.3)
Chloride: 101 mmol/L (ref 98–111)
Creatinine, Ser: 0.98 mg/dL (ref 0.44–1.00)
GFR calc Af Amer: >60 mL/min (ref >60)
GFR calc non Af Amer: >60 mL/min (ref >60)
Glucose, Bld: 94 mg/dL (ref 70–99)
Potassium: 3.8 mmol/L (ref 3.5–5.1)
Sodium: 141 mmol/L (ref 135–145)

## 2020-06-03 LAB — CBC
HCT: 38.3 % (ref 36.0–46.0)
Hemoglobin: 12.6 g/dL (ref 12.0–15.0)
MCH: 32.3 pg (ref 26.0–34.0)
MCHC: 32.9 g/dL (ref 30.0–36.0)
MCV: 98.2 fL (ref 80.0–100.0)
Platelets: 376 10*3/uL (ref 150–400)
RBC: 3.9 MIL/uL (ref 3.87–5.11)
RDW: 14.8 % (ref 11.5–15.5)
WBC: 11.3 10*3/uL — ABNORMAL HIGH (ref 4.0–10.5)
nRBC: 0 % (ref 0.0–0.2)

## 2020-06-03 LAB — TROPONIN I (HIGH SENSITIVITY): Troponin I (High Sensitivity): <2 ng/L (ref <18)

## 2020-06-03 NOTE — ED Triage Notes (Signed)
Patient reports to the ER for chest pain. Patient reports it feels like a fist putting pressure on her chest and radiating to back.

## 2020-07-02 ENCOUNTER — Telehealth: Payer: Self-pay | Admitting: Family Medicine

## 2020-07-02 NOTE — Telephone Encounter (Signed)
Initial Comment Caller states she has been experiencing bleeding of the nipples. Caller states the bleeding is not uncontrollable. Translation No Disp. Time Lamount Cohen Time) Disposition Final User 07/02/2020 11:14:55 AM Attempt made - message left Alexander Mt RNNicholaus Bloom 07/02/2020 11:29:49 AM Attempt made - message left Alexander Mt, RN, Nicholaus Bloom 07/02/2020 11:40:24 AM FINAL ATTEMPT MADE - message left Yes Alexander Mt, RN, Laurin Coder

## 2020-07-08 ENCOUNTER — Other Ambulatory Visit: Payer: Self-pay | Admitting: Family Medicine

## 2020-07-09 ENCOUNTER — Other Ambulatory Visit: Payer: Self-pay | Admitting: Family Medicine

## 2020-07-09 ENCOUNTER — Ambulatory Visit: Payer: BLUE CROSS/BLUE SHIELD | Admitting: Family Medicine

## 2020-07-09 DIAGNOSIS — N644 Mastodynia: Secondary | ICD-10-CM

## 2020-07-09 DIAGNOSIS — N6452 Nipple discharge: Secondary | ICD-10-CM

## 2020-07-29 ENCOUNTER — Ambulatory Visit
Admission: RE | Admit: 2020-07-29 | Discharge: 2020-07-29 | Disposition: A | Discharge status: Home / Self Care | Payer: Medicare Other | Source: Ambulatory Visit | Attending: Family Medicine | Admitting: Family Medicine

## 2020-07-29 ENCOUNTER — Other Ambulatory Visit: Payer: Self-pay | Admitting: Family Medicine

## 2020-07-29 ENCOUNTER — Other Ambulatory Visit: Payer: Self-pay

## 2020-07-29 ENCOUNTER — Ambulatory Visit: Payer: BLUE CROSS/BLUE SHIELD

## 2020-07-29 DIAGNOSIS — N6452 Nipple discharge: Secondary | ICD-10-CM

## 2020-07-29 DIAGNOSIS — N644 Mastodynia: Secondary | ICD-10-CM

## 2020-08-10 ENCOUNTER — Other Ambulatory Visit: Payer: BLUE CROSS/BLUE SHIELD

## 2020-08-11 ENCOUNTER — Other Ambulatory Visit: Payer: Self-pay | Admitting: Family Medicine

## 2020-08-11 ENCOUNTER — Telehealth: Payer: Self-pay

## 2020-08-11 ENCOUNTER — Other Ambulatory Visit: Payer: BLUE CROSS/BLUE SHIELD

## 2020-08-11 NOTE — Telephone Encounter (Signed)
..   LAST APPOINTMENT DATE: 08/11/2020   NEXT APPOINTMENT DATE:@Visit  date not found  MEDICATION:lisinopril (ZESTRIL) 20 MG tablet  montelukast (SINGULAIR) 10 MG tablet    PHARMACY: [facility] - [place], [place] - [address], [address]  Pt is requesting 90 day supply

## 2020-08-11 NOTE — Telephone Encounter (Signed)
Pt is scheduled °

## 2020-08-11 NOTE — Telephone Encounter (Signed)
Pt has not been seen since 01/30/19. She has also had multiple cancellations. Pt will need appointment prior to sending refills.   Thank You

## 2020-08-12 ENCOUNTER — Other Ambulatory Visit: Payer: Self-pay

## 2020-08-12 ENCOUNTER — Ambulatory Visit
Admission: RE | Admit: 2020-08-12 | Discharge: 2020-08-12 | Disposition: A | Discharge status: Home / Self Care | Payer: Medicare Other | Source: Ambulatory Visit | Attending: Family Medicine | Admitting: Family Medicine

## 2020-08-12 DIAGNOSIS — N644 Mastodynia: Secondary | ICD-10-CM

## 2020-08-12 DIAGNOSIS — N6452 Nipple discharge: Secondary | ICD-10-CM

## 2020-08-19 ENCOUNTER — Ambulatory Visit: Payer: BLUE CROSS/BLUE SHIELD | Admitting: Family Medicine

## 2020-08-21 ENCOUNTER — Ambulatory Visit: Payer: BLUE CROSS/BLUE SHIELD | Admitting: Family Medicine

## 2020-08-25 ENCOUNTER — Other Ambulatory Visit: Payer: Self-pay | Admitting: Family Medicine

## 2020-11-10 ENCOUNTER — Other Ambulatory Visit: Payer: Self-pay | Admitting: Family Medicine

## 2020-12-11 ENCOUNTER — Telehealth: Payer: Self-pay

## 2020-12-11 NOTE — Telephone Encounter (Signed)
.   LAST APPOINTMENT DATE: 11/10/2020   NEXT APPOINTMENT DATE:@Visit  date not found  MEDICATION:donepezil (ARICEPT) 5 MG tablet   PHARMACY:[id number] [place], [place] - [address] [place]  Let patient know to contact pharmacy at the end of the day to make sure medication is ready.  Please notify patient to allow 48-72 hours to process  Encourage patient to contact the pharmacy for refills or they can request refills through [facility]  CLINICAL FILLS OUT ALL BELOW:   LAST REFILL:  QTY:  REFILL DATE:    OTHER COMMENTS:    Okay for refill?  Please advise

## 2020-12-11 NOTE — Telephone Encounter (Signed)
Pt has an appointment 12/23/2020

## 2020-12-15 ENCOUNTER — Telehealth: Payer: Self-pay

## 2020-12-15 NOTE — Telephone Encounter (Signed)
Patient is calling in wondering if Dr.Wolfe is the one who prescribed a memory loss medications, she states that she went to see a doctor and they threw it away.

## 2020-12-15 NOTE — Telephone Encounter (Signed)
I spoke with pt to address concerns, she confirms message below. She says that her medicine was thrown away, by someone in her home. Pt will discuss concerns in upcoming appointment with Dr. Artis Flock on 12/23/2020. Pt voiced understanding and will try to find transportation.

## 2020-12-16 NOTE — Telephone Encounter (Signed)
Brandi Fitzgerald from [facility] would like to make [place] aware of the situation below and has not taken medication for her memory loss due to it being thrown away, just as patient told mellitta. Just sn FYI

## 2020-12-23 ENCOUNTER — Ambulatory Visit: Payer: BLUE CROSS/BLUE SHIELD | Admitting: Family Medicine

## 2020-12-23 ENCOUNTER — Ambulatory Visit (INDEPENDENT_AMBULATORY_CARE_PROVIDER_SITE_OTHER): Payer: Medicare Other | Admitting: Family Medicine

## 2020-12-23 ENCOUNTER — Other Ambulatory Visit: Payer: Self-pay

## 2020-12-23 DIAGNOSIS — R413 Other amnesia: Secondary | ICD-10-CM

## 2020-12-23 NOTE — Progress Notes (Signed)
I did not see patient. She left before I saw her and told my CMA she already sees another doctor. She is correct, appears to be seeing a physician in [place] which is where she Is from. Has not been seen in this clinic since 2020.   Orland Mustard, MD [facility]

## 2021-01-15 ENCOUNTER — Ambulatory Visit: Payer: BLUE CROSS/BLUE SHIELD | Admitting: Family Medicine

## 2021-05-05 ENCOUNTER — Other Ambulatory Visit: Payer: Self-pay | Admitting: Family Medicine

## 2021-07-24 IMAGING — CR DG CHEST 2V
2 series · 2 of 2 positions shown · non-contrast
Comparison: February 13, 2020.

CLINICAL DATA: Chest pain.

EXAM:
CHEST - 2 VIEW

[w chest pa]
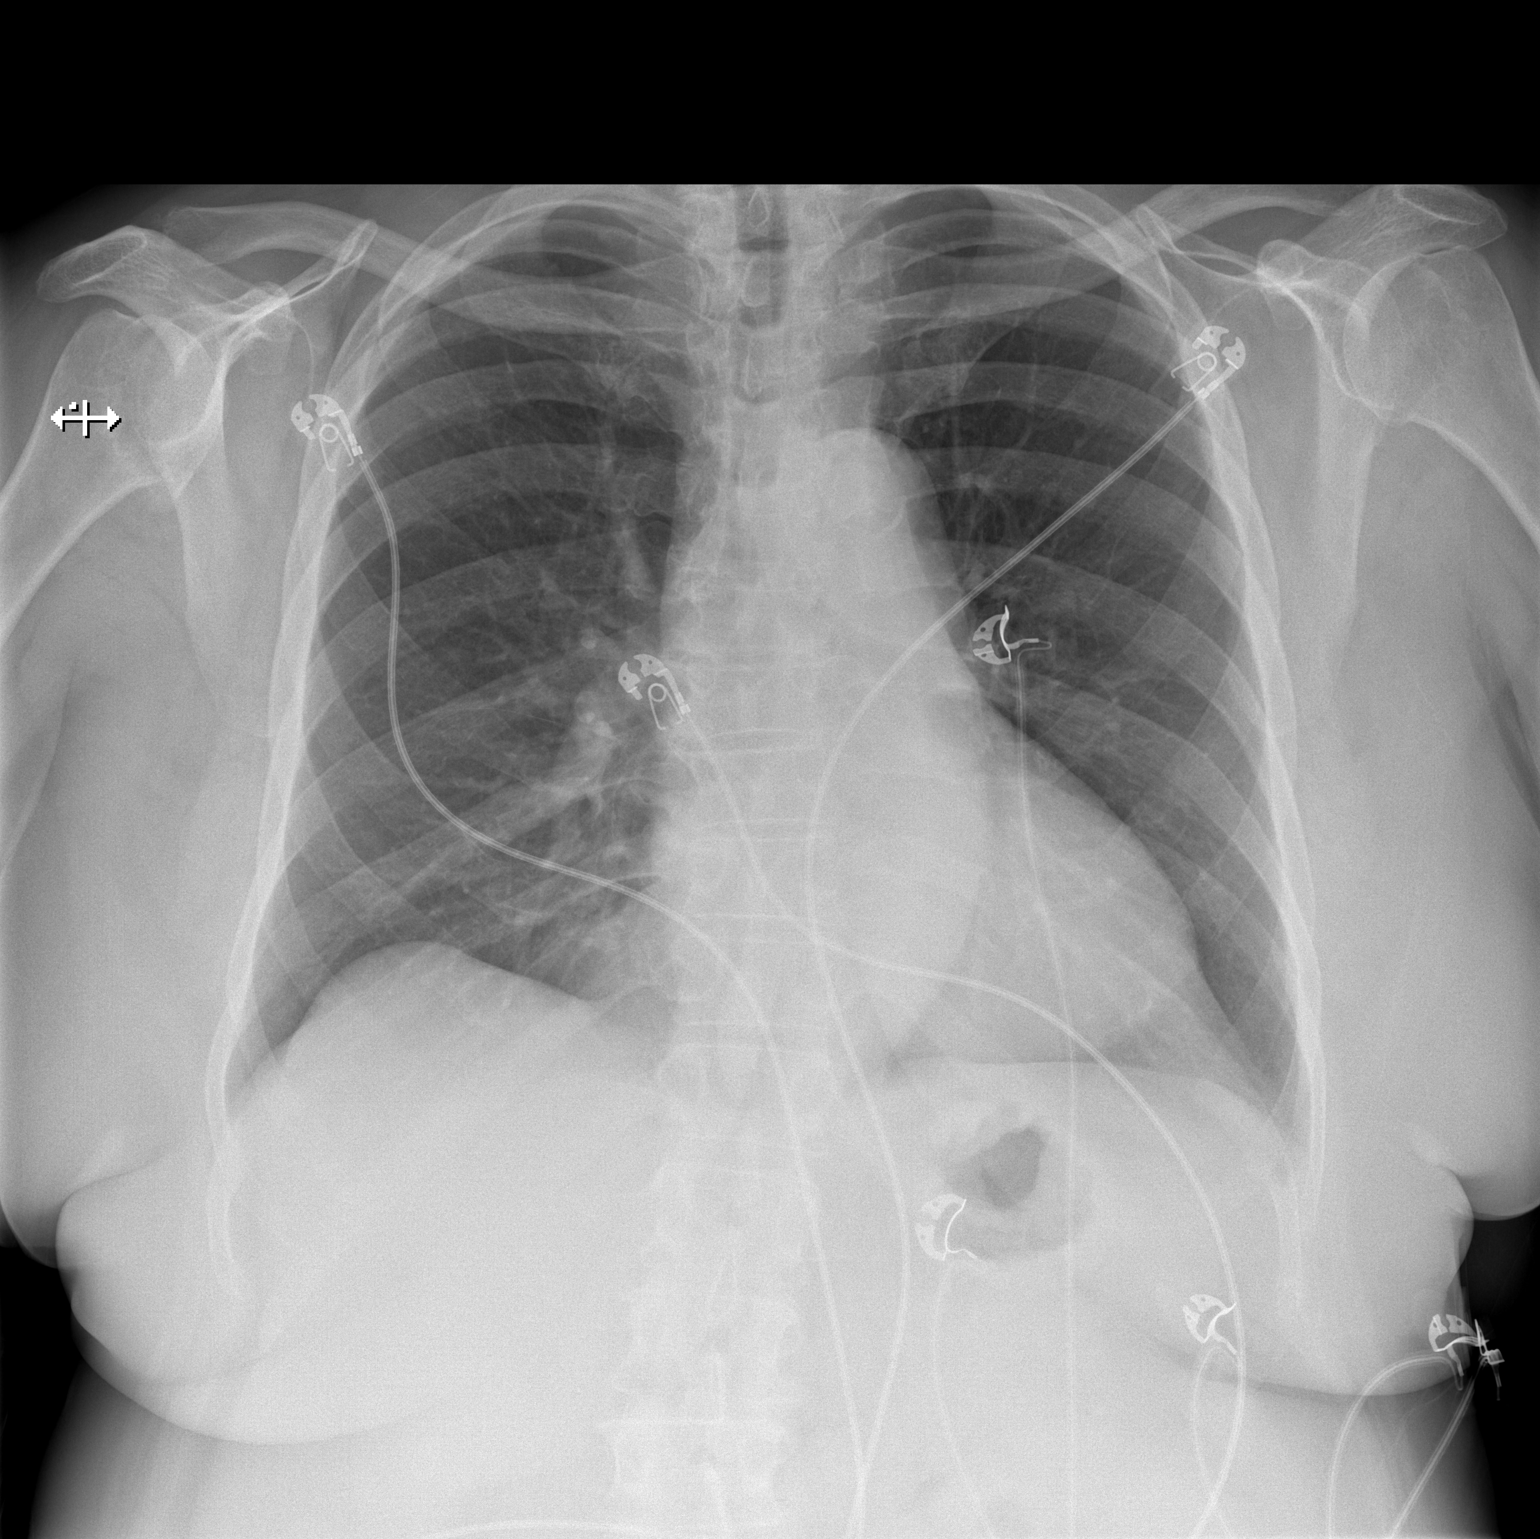

[w chest lat]
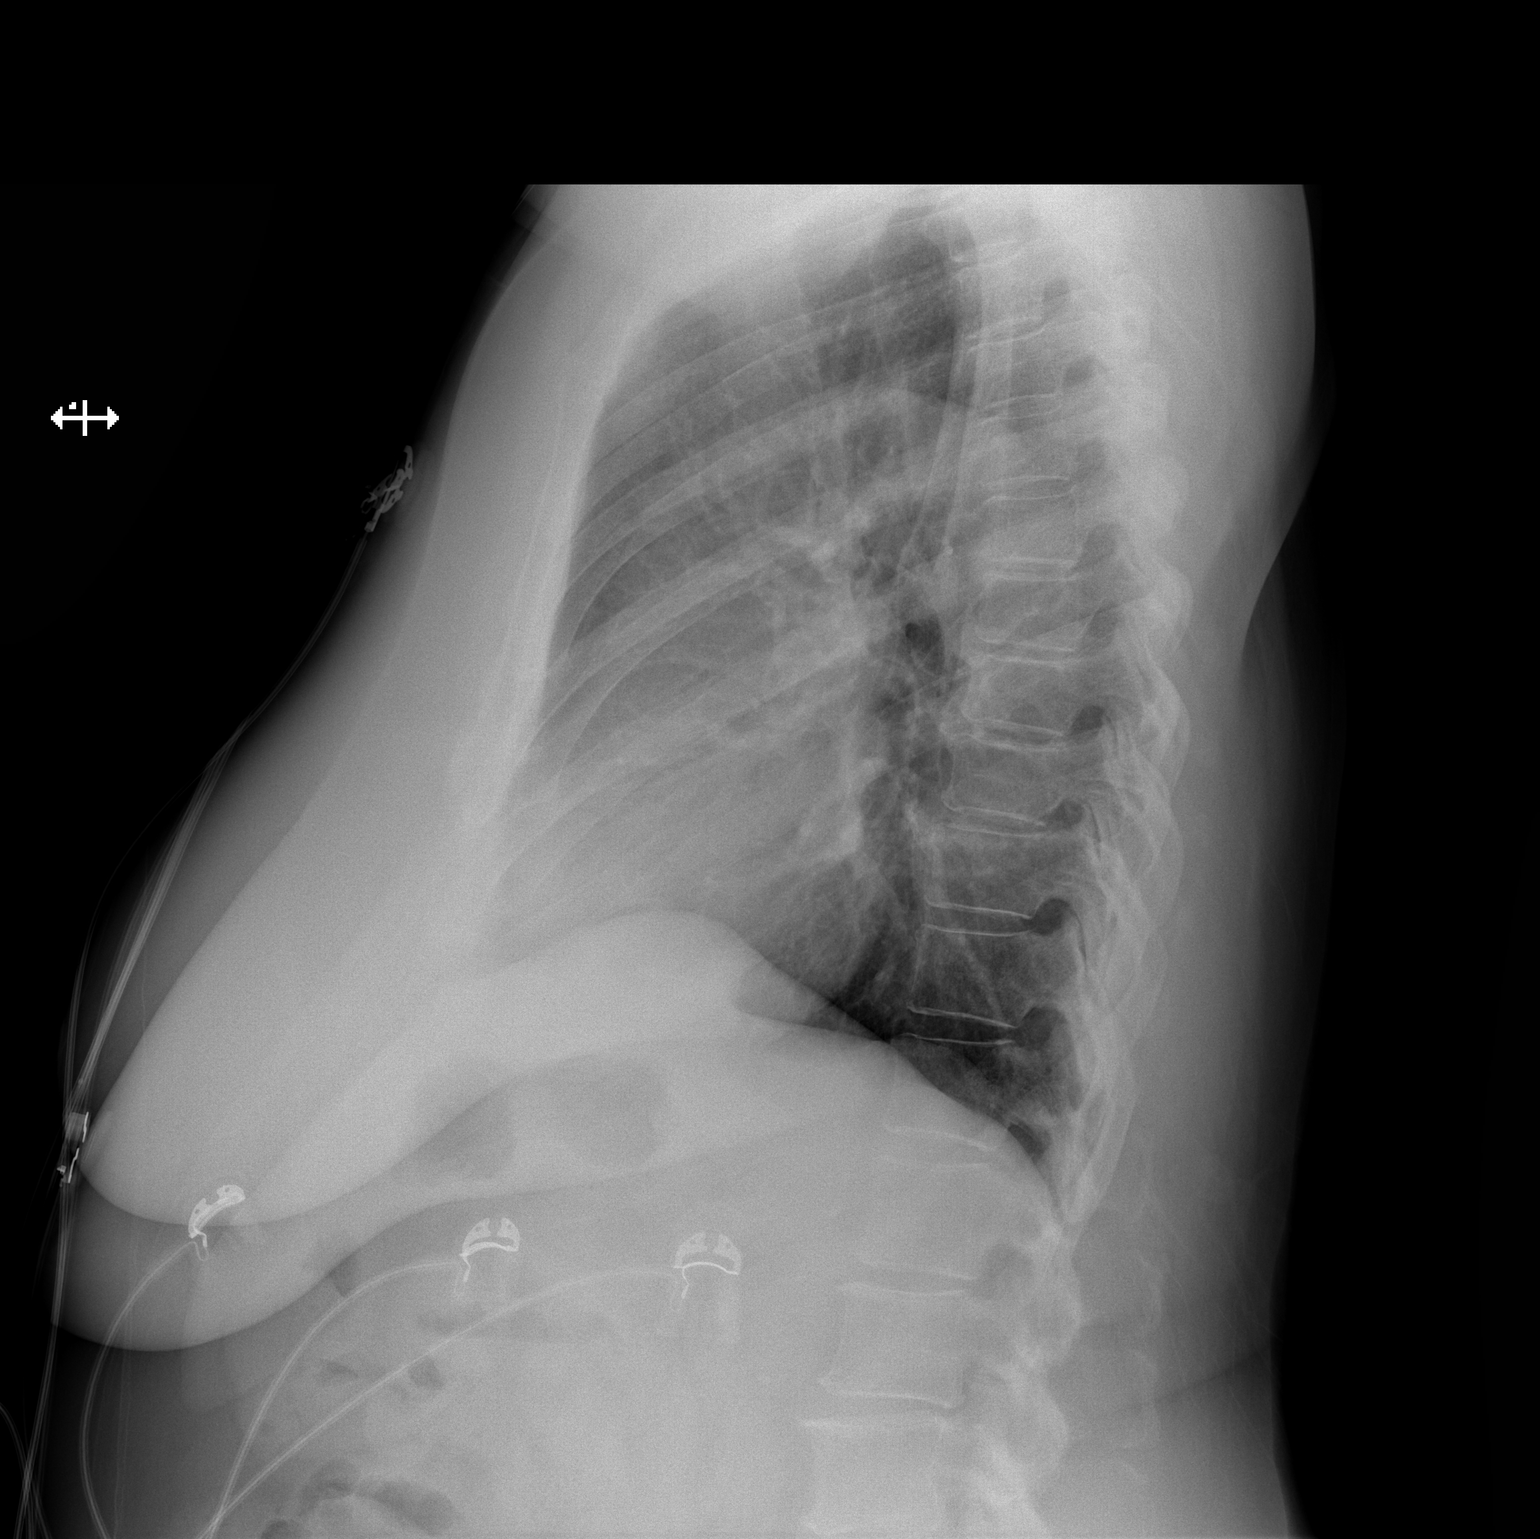

[2 of 2 positions shown; findings below may reference images not displayed]

FINDINGS: The heart size and mediastinal contours are within normal limits.
Both lungs are clear. No pneumothorax or pleural effusion is noted.
The visualized skeletal structures are unremarkable.
IMPRESSION: No active cardiopulmonary disease.

## 2021-09-07 ENCOUNTER — Other Ambulatory Visit: Payer: Self-pay | Admitting: Family Medicine

## 2021-09-10 ENCOUNTER — Other Ambulatory Visit: Payer: Self-pay | Admitting: Family Medicine

## 2021-09-18 IMAGING — MG DIGITAL DIAGNOSTIC BILAT W/ TOMO W/ CAD
6 of 10 series · 6 of 30 positions shown · non-contrast
Comparison: Previous exam(s).

CLINICAL DATA: Patient presents for evaluation of single duct right
nipple discharge which is both bloody and clear.

EXAM:
DIGITAL DIAGNOSTIC BILATERAL MAMMOGRAM WITH CAD AND TOMO
ULTRASOUND RIGHT BREAST

[R CC synth-2D]
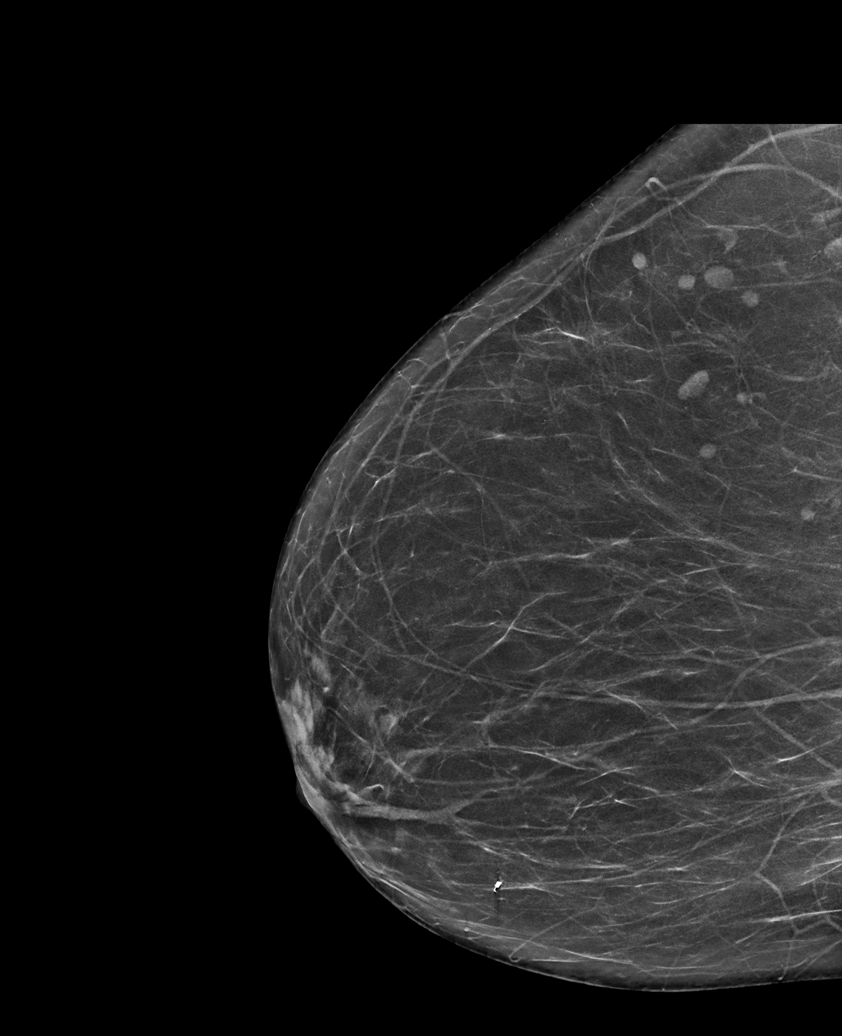

[R MLO synth-2D (1 of 2)]
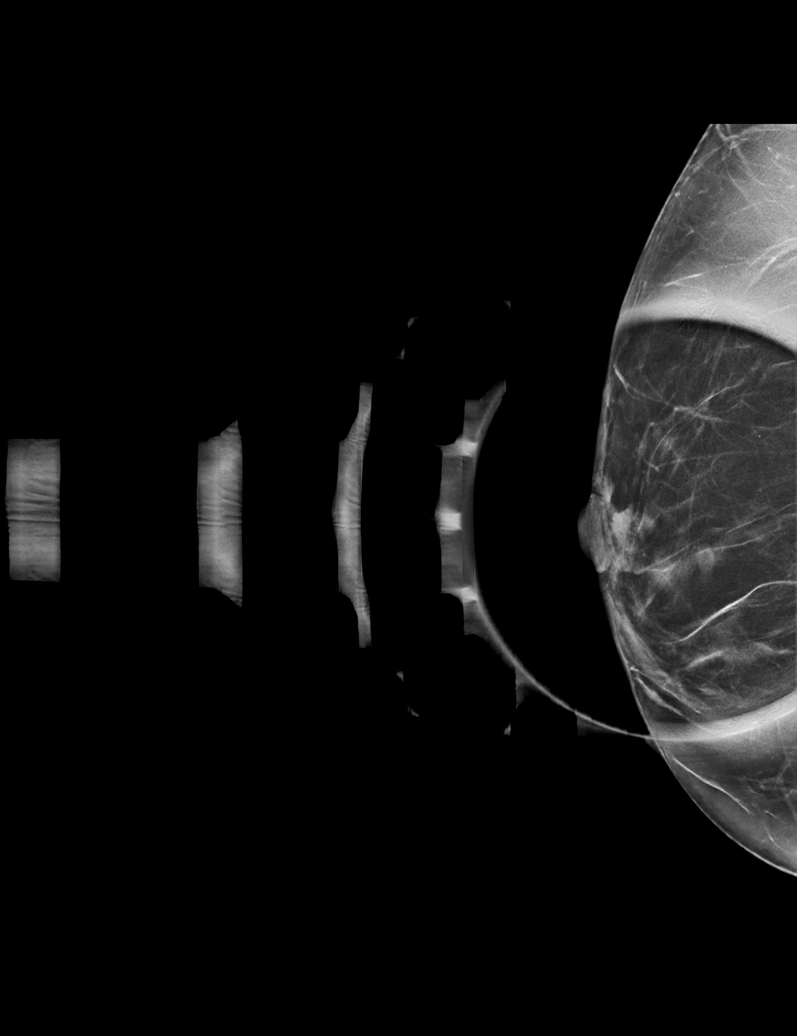

[L CC synth-2D]
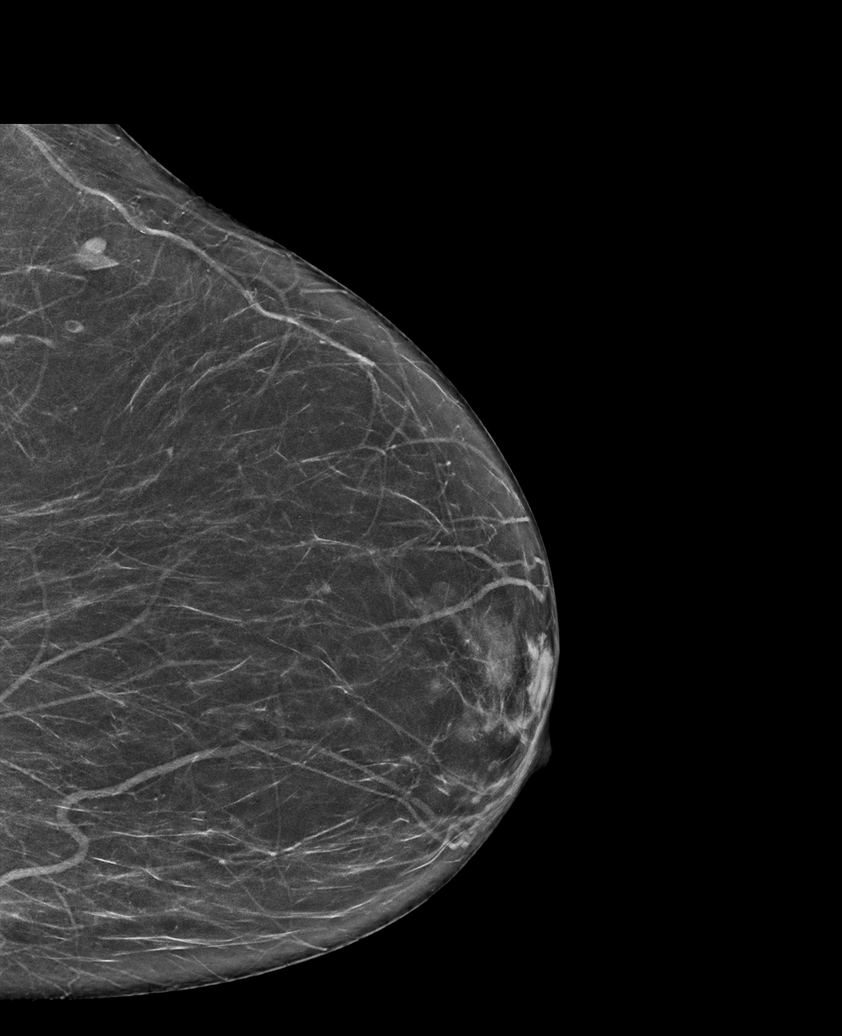

[R MLO synth-2D (2 of 2)]
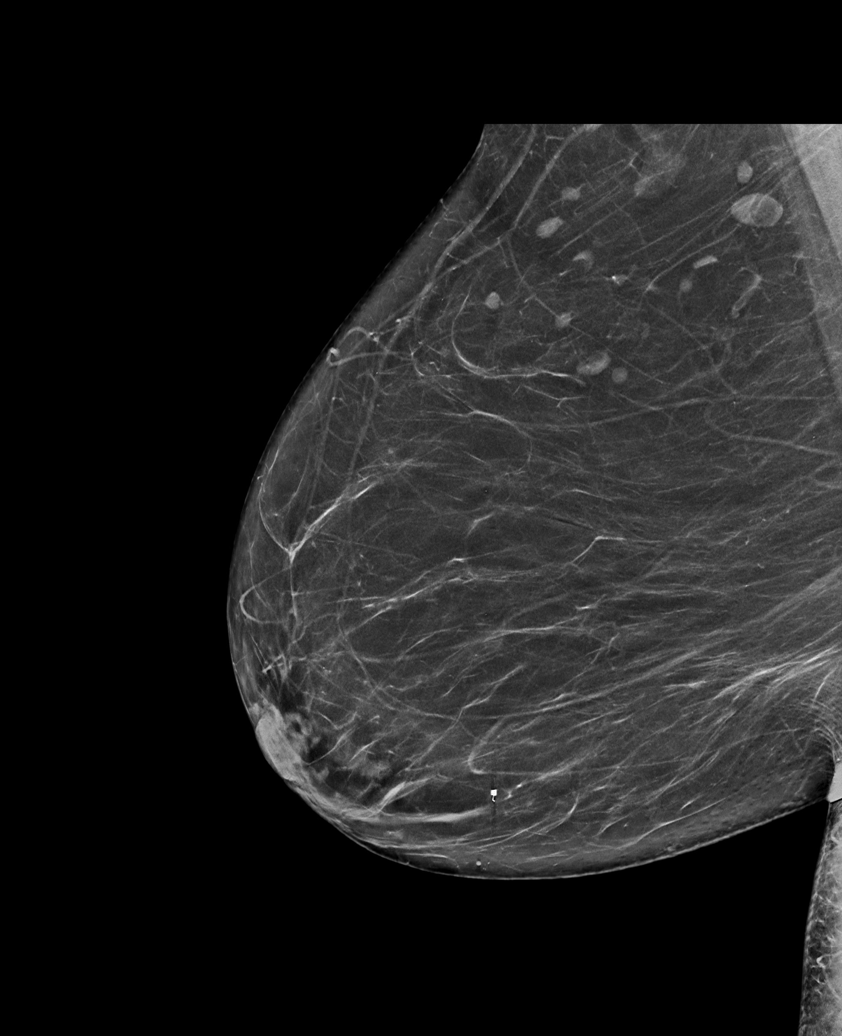

[L MLO synth-2D]
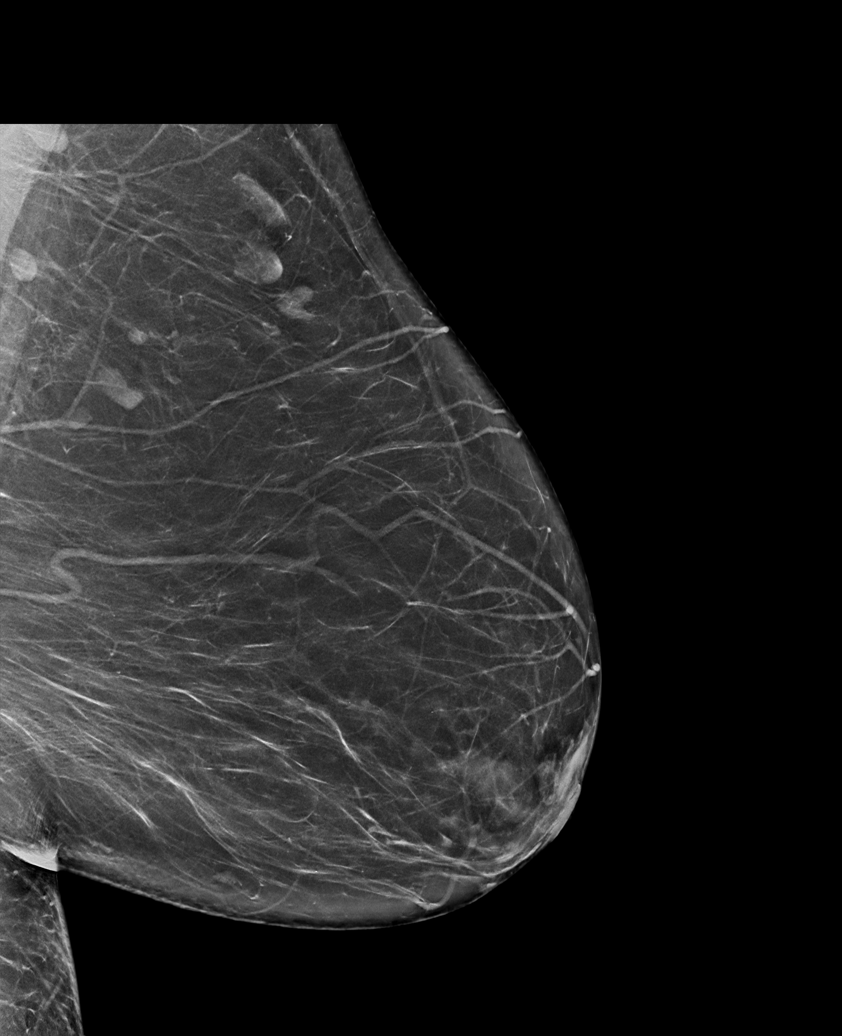

[L CC tomo · tomo slice 31/62.0]
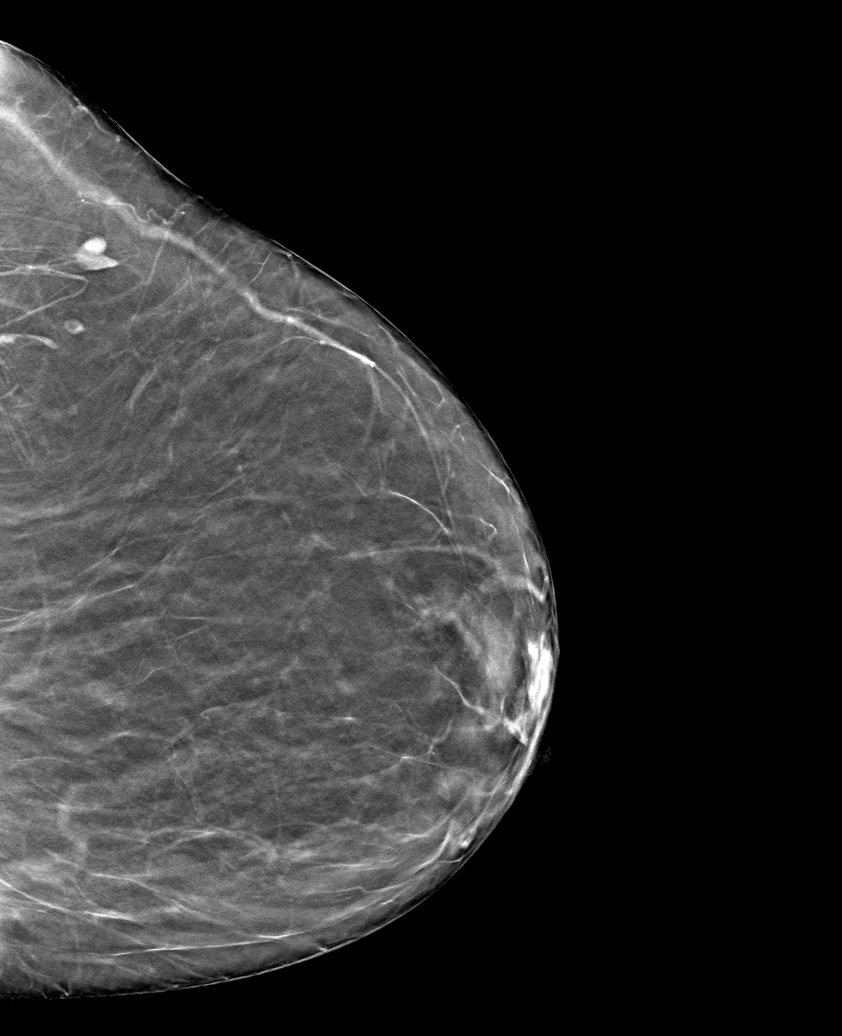

[6 of 30 positions shown; findings below may reference images not displayed]

ACR Breast Density Category b: There are scattered areas of
fibroglandular density.
FINDINGS: No concerning masses, calcifications or nonsurgical distortion
identified within either breast.

Mammographic images were processed with CAD.

Targeted ultrasound is performed, showing a dilated duct within the
retroareolar right breast 7 o'clock position containing an
intraductal mass measuring up to 10 mm with associated vascularity.
No right axillary adenopathy.
IMPRESSION: Suspicious right nipple discharge.

Probable intraductal mass retroareolar right breast 7 o'clock
position.

RECOMMENDATION:
Ultrasound-guided core needle biopsy retroareolar mass right breast
7 o'clock position.

I have discussed the findings and recommendations with the patient.
If applicable, a reminder letter will be sent to the patient
regarding the next appointment.

BI-RADS CATEGORY  4: Suspicious.

## 2021-10-02 IMAGING — US US  BREAST BX W/ LOC DEV 1ST LESION IMG BX SPEC US GUIDE*R*
1 series · 8 of 8 positions shown · non-contrast
Comparison: Previous exam(s).
COMPARISON: Previous exam(s).

Addendum:
CLINICAL DATA: 64-year female with history of right nipple
discharge presenting for biopsy of an intraductal mass.

EXAM:
ULTRASOUND GUIDED RIGHT BREAST CORE NEEDLE BIOPSY

[Series 1: us breast bx w/ loc dev 1st lesion img bx spec us  · 0.05mm/px · 8 of 8 slices shown]
[im 1/8]
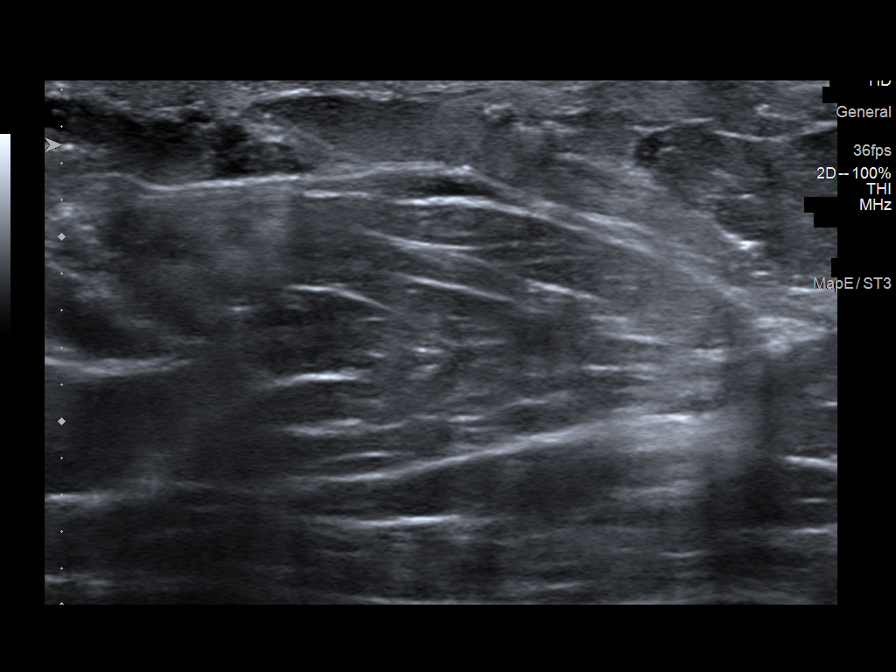
[im 2/8]
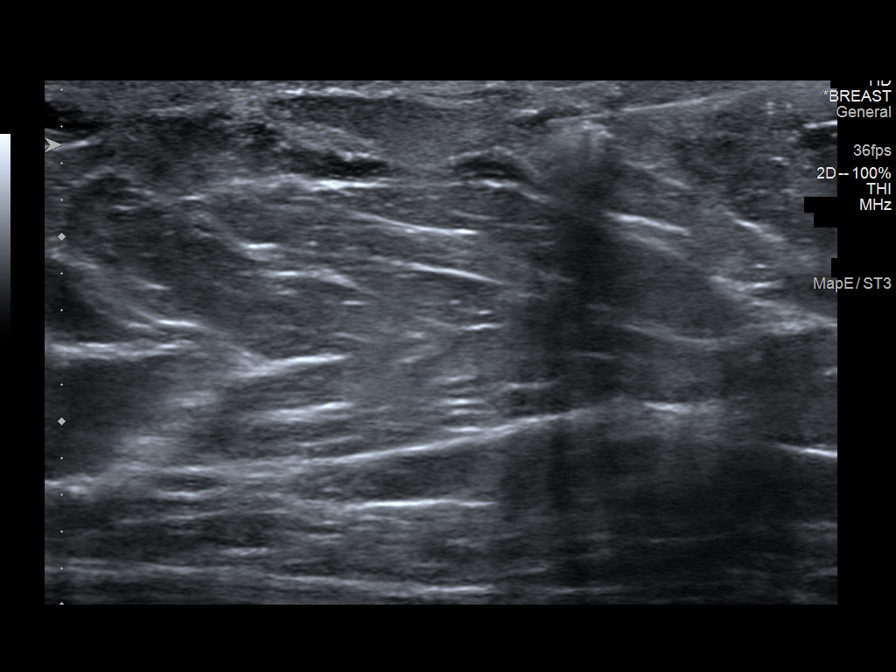
[im 3/8]
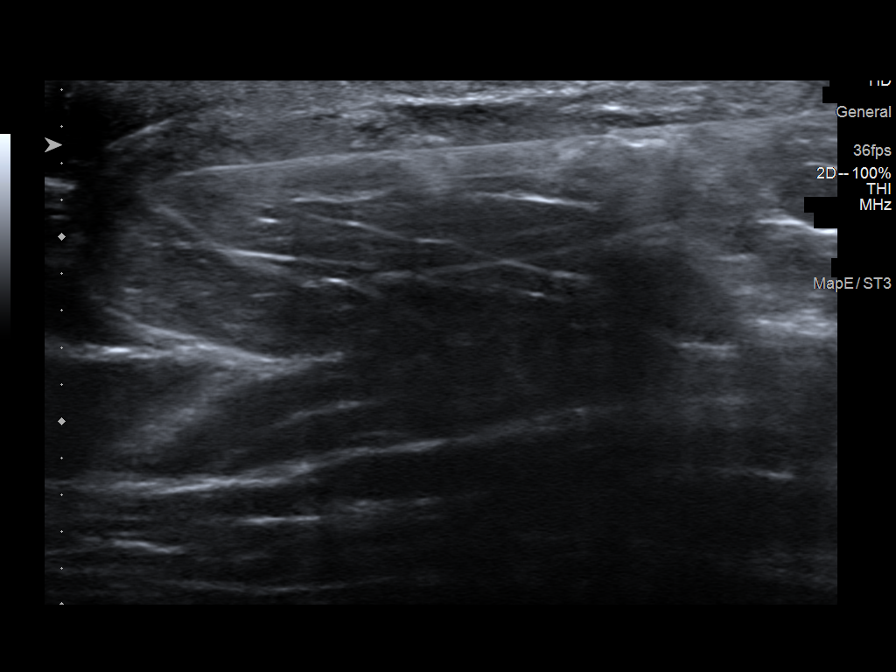
[im 4/8]
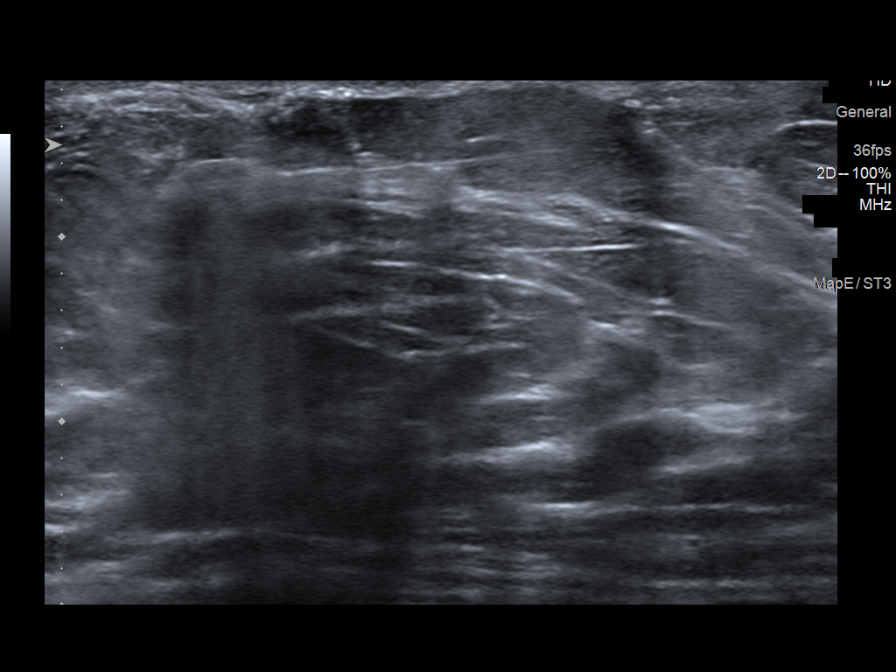
[im 5/8]
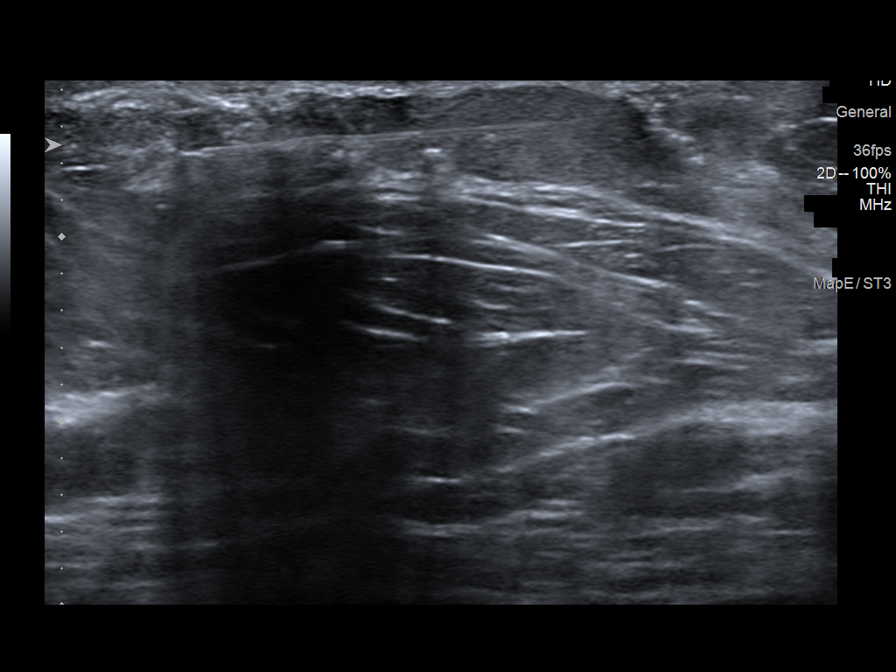
[im 6/8]
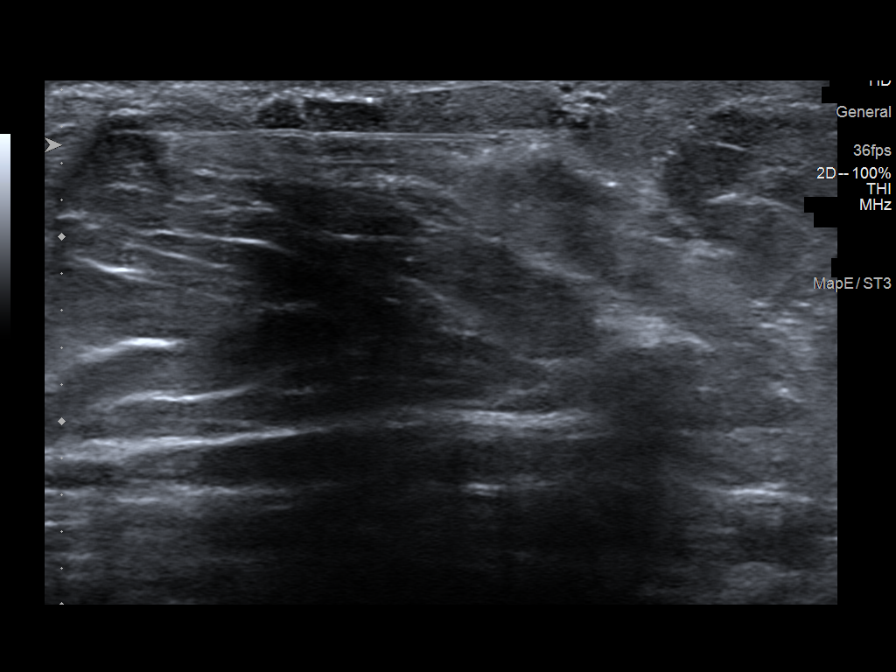
[im 7/8]
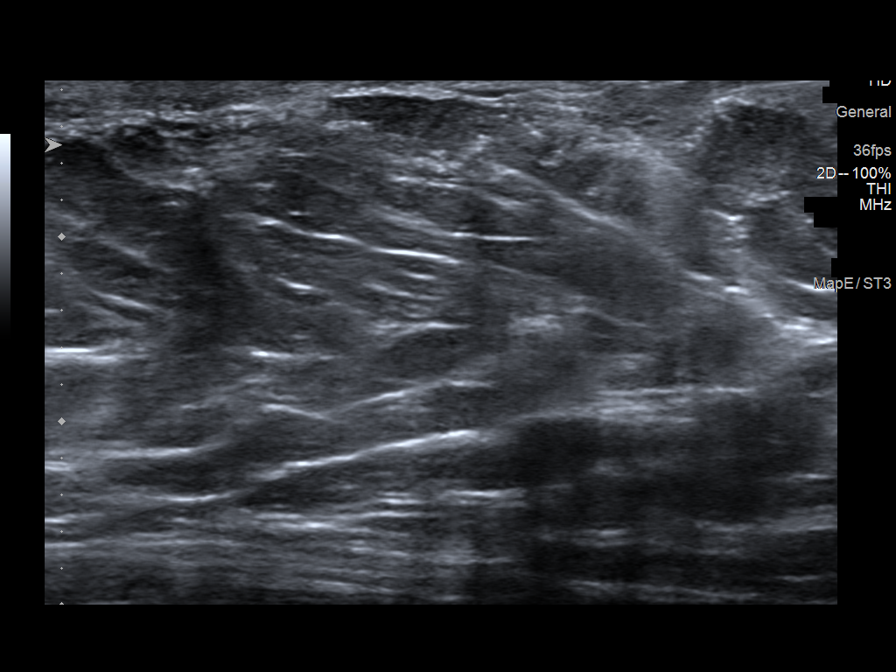
[im 8/8]
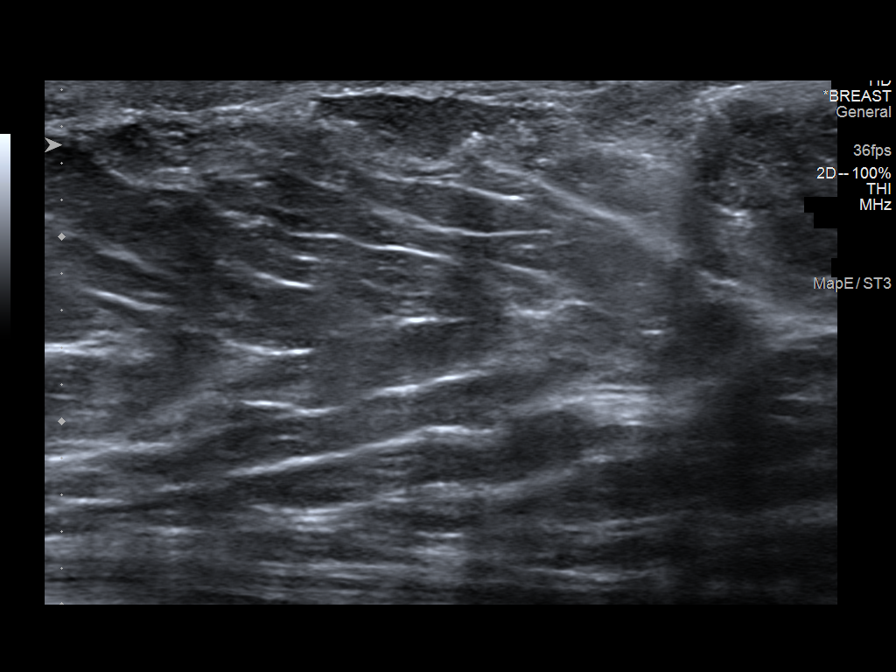

[8 of 8 positions shown; findings below may reference images not displayed]



Lesion quadrant: Lower inner quadrant

Using sterile technique and 1% Lidocaine as local anesthetic, under
direct ultrasound visualization, a 14 gauge Fanaa device was
used to perform biopsy of an intraductal mass in the right breast at
retroareolar/inferior using a inferior approach. At the conclusion
of the procedure a ribbon tissue marker clip was deployed into the
biopsy cavity. Follow up 2 view mammogram was performed and dictated
separately.
IMPRESSION: Ultrasound guided biopsy of an intraductal mass in the
retroareolar/inferior right breast. No apparent complications.

ADDENDUM:
Pathology revealed DUCTAL PAPILLOMA of the Right breast,
retroareolar. This was found to be concordant by Dr. Takisha
Suruceanu, with excision recommended.

Pathology results were discussed with the patient by telephone. The
patient reported doing well after the biopsy with tenderness at the
site. Post biopsy instructions and care were reviewed and questions
were answered. The patient was encouraged to call The [REDACTED] for any additional concerns. My direct phone
number was provided.

Surgical consultation has been arranged with Dr. Sreekanto Hkan at
[REDACTED] on September 15, 2020.

Pathology results reported by Axel Estuardo He He, RN on 08/13/2020.



Lesion quadrant: Lower inner quadrant

Using sterile technique and 1% Lidocaine as local anesthetic, under
direct ultrasound visualization, a 14 gauge Fanaa device was
used to perform biopsy of an intraductal mass in the right breast at
retroareolar/inferior using a inferior approach. At the conclusion
of the procedure a ribbon tissue marker clip was deployed into the
biopsy cavity. Follow up 2 view mammogram was performed and dictated
separately.
IMPRESSION: Ultrasound guided biopsy of an intraductal mass in the
retroareolar/inferior right breast. No apparent complications.

## 2022-09-15 ENCOUNTER — Other Ambulatory Visit: Payer: Self-pay

## 2022-09-15 ENCOUNTER — Emergency Department (HOSPITAL_BASED_OUTPATIENT_CLINIC_OR_DEPARTMENT_OTHER): Payer: Medicare Other

## 2022-09-15 ENCOUNTER — Encounter (HOSPITAL_BASED_OUTPATIENT_CLINIC_OR_DEPARTMENT_OTHER): Payer: Self-pay | Admitting: *Deleted

## 2022-09-15 ENCOUNTER — Emergency Department (HOSPITAL_BASED_OUTPATIENT_CLINIC_OR_DEPARTMENT_OTHER)
Admission: EM | Admit: 2022-09-15 | Discharge: 2022-09-15 | Discharge status: Against Medical Advice | Payer: Medicare Other | Attending: Emergency Medicine | Admitting: Emergency Medicine

## 2022-09-15 DIAGNOSIS — S0081XA Abrasion of other part of head, initial encounter: Secondary | ICD-10-CM | POA: Insufficient documentation

## 2022-09-15 DIAGNOSIS — S0990XA Unspecified injury of head, initial encounter: Secondary | ICD-10-CM | POA: Diagnosis present

## 2022-09-15 DIAGNOSIS — X58XXXA Exposure to other specified factors, initial encounter: Secondary | ICD-10-CM | POA: Diagnosis not present

## 2022-09-15 DIAGNOSIS — F0390 Unspecified dementia without behavioral disturbance: Secondary | ICD-10-CM | POA: Insufficient documentation

## 2022-09-15 DIAGNOSIS — Z5321 Procedure and treatment not carried out due to patient leaving prior to being seen by health care provider: Secondary | ICD-10-CM | POA: Diagnosis not present

## 2022-09-15 HISTORY — DX: Unspecified dementia, unspecified severity, without behavioral disturbance, psychotic disturbance, mood disturbance, and anxiety: F03.90

## 2022-09-15 NOTE — ED Notes (Signed)
Pt advises me that she has no interest in waiting and feels fine and will leave

## 2022-09-15 NOTE — ED Notes (Signed)
Pt had told me after triage that she was not willing to wait and I have not visualized her in waiting area since

## 2022-09-15 NOTE — ED Triage Notes (Signed)
Pt is here for superficial abrasion to right forehead which her significant other noted this am.  Pt has dementia and can not recall what occurred.  Pt is alert and oriented at baseline (some confusion noted which is her baseline)

## 2022-11-23 ENCOUNTER — Ambulatory Visit: Payer: Medicare Other | Admitting: Neurology

## 2022-11-23 ENCOUNTER — Encounter: Payer: Self-pay | Admitting: Neurology

## 2023-01-03 ENCOUNTER — Ambulatory Visit (INDEPENDENT_AMBULATORY_CARE_PROVIDER_SITE_OTHER): Payer: Medicare Other | Admitting: Neurology

## 2023-01-03 ENCOUNTER — Encounter: Payer: Self-pay | Admitting: Neurology

## 2023-01-03 VITALS — BP 153/90 | HR 69 | Ht 68.0 in | Wt 172.0 lb

## 2023-01-03 DIAGNOSIS — R7303 Prediabetes: Secondary | ICD-10-CM

## 2023-01-03 DIAGNOSIS — F03A0 Unspecified dementia, mild, without behavioral disturbance, psychotic disturbance, mood disturbance, and anxiety: Secondary | ICD-10-CM

## 2023-01-03 DIAGNOSIS — G309 Alzheimer's disease, unspecified: Secondary | ICD-10-CM

## 2023-01-03 MED ORDER — DONEPEZIL HCL 10 MG PO TABS: 10.0000 mg | ORAL_TABLET | Freq: Every day | ORAL | 11 refills | Status: DC

## 2023-01-03 NOTE — Progress Notes (Signed)
[facility]  PATIENT: Brandi Fitzgerald DOB: 1956/05/12  REQUESTING CLINICIAN: Elby Beck, DO HISTORY FROM: Patient and boyfriend Charles  REASON FOR VISIT: Memory deficit    HISTORICAL  CHIEF COMPLAINT:  Chief Complaint  Patient presents with   New Patient (Initial Visit)    Rm 12, boyfriend present Memory concerns Mocha:12    HISTORY OF PRESENT ILLNESS:  This is a 67 year old woman past medical history hypertension, hyperlipidemia, rheumatoid arthritis, memory problem who is presenting for memory loss.  She is accompanied by her boyfriend who provides most of the history.  Per boyfriend, patient memory loss started 6 years ago after the death of her husband.  Memory problem described as being forgetful, repeating herself, and asking the same questions.  Sometimes, she forgets to take her own medications. Patient is independent, she does live alone and independent all ADLs.  She still drives, denies any recent accident or being lost in familiar places.  She does pay her own bills, she has them on autopay. She has family history of dementia and in her mother and maternal grandmother     TBI:   No past history of TBI Stroke:   no past history of stroke Seizures:   no past history of seizures Sleep:   no history of sleep apnea.   Mood:   patient denies anxiety and depression Family history of Dementia: Mother with Dementia (late 32s)  Denies  Functional status: independent in all ADLs and IADLs Patient lives with Juanda Crumble, his boyfriend. Cooking: yes  Cleaning: Yes  Shopping: Yes Bathing: no issues  Toileting: No issues  Driving: Still drive but does not feel comfortable.  Bills: Patient, autopay   Ever left the stove on by accident?: Denies  Forget how to use items around the house?: Denies  Getting lost going to familiar places?: Denies  Forgetting loved ones names?: Denies  Word finding difficulty? Denies  Sleep: Good    OTHER MEDICAL  CONDITIONS: Hypertension, hyperlipidemia, diabetes, rheumatoid arthritis.   REVIEW OF SYSTEMS: Full 14 system review of systems performed and negative with exception of: As noted in the HPI.   ALLERGIES: Allergies  Allergen Reactions   Januvia [Sitagliptin Phosphate] Anaphylaxis   Aspirin     Hives   Cephalosporins Swelling    Rash, itching   Penicillins Itching and Swelling    HOME MEDICATIONS: Outpatient Medications Prior to Visit  Medication Sig Dispense Refill   Abatacept 125 MG/ML SOSY Inject 125 mg into the skin every 14 (fourteen) days.     ipratropium (ATROVENT) 0.03 % nasal spray Place 2 sprays into both nostrils daily.     lisinopril (ZESTRIL) 20 MG tablet TAKE 1 TABLET BY MOUTH IN  THE MORNING 30 tablet 5   metFORMIN (GLUCOPHAGE) 500 MG tablet TAKE 1 TABLET BY MOUTH  DAILY WITH BREAKFAST 30 tablet 11   montelukast (SINGULAIR) 10 MG tablet Take 1 tablet (10 mg total) by mouth at bedtime. Please schedule a follow up appt with Dr. Rogers Blocker for further refills [phone] 30 tablet 0   ORENCIA 125 MG/ML SOSY Inject 125 mg into the muscle once a week.     predniSONE (DELTASONE) 5 MG tablet Take 5 mg by mouth daily with breakfast.     rosuvastatin (CRESTOR) 20 MG tablet TAKE 1 TABLET BY MOUTH  DAILY 30 tablet 0   valACYclovir (VALTREX) 500 MG tablet One tablet BID x 3 days for outbreak start within 24 hour of symptom onset 18 tablet 3  VITAMIN D, CHOLECALCIFEROL, PO Take by mouth. Vit d 1.25 mg weekly     donepezil (ARICEPT) 5 MG tablet Take 1 tablet (5 mg total) by mouth at bedtime. Patient needs to call our office to schedule a appointment for further refills [phone] 30 tablet 0   HYDROcodone-acetaminophen (NORCO) 7.5-325 MG per tablet Take 1-2 tablets by mouth every 4 (four) hours. (Patient taking differently: Take 1-2 tablets by mouth 4 (four) times daily as needed. ) 100 tablet 0   No facility-administered medications prior to visit.    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   Procedure: LEFT TOTAL KNEE ARTHROPLASTY;  Surgeon: Mauri Pole, MD;  Location: [facility];  Service: Orthopedics;  Laterality: Left;   WRIST ARTHROSCOPY     right    FAMILY HISTORY: Family History  Problem Relation Age of Onset   Heart failure Mother    Hypertension Mother    Hyperlipidemia Mother    Arthritis Mother    Alzheimer's disease Mother        late 45's   Heart disease Father    Alcohol abuse Father    Arthritis Father    Arthritis Sister    Diabetes Sister    Hyperlipidemia Sister    Hypertension Sister    Memory loss Sister    Memory loss Brother    Drug abuse Brother    Heart disease Brother    Arthritis Sister    Memory loss Sister    Alzheimer's disease Maternal Uncle     SOCIAL HISTORY: Social History   Socioeconomic History   Marital status: Married    Spouse name: Not on file   Number of children: Not on file   Years of education: Not on file   Highest education level: Not on file  Occupational History   Not on file  Tobacco Use   Smoking status: Former    Types: Cigarettes    Quit date: 01/28/1999    Years since quitting: 23.9   Smokeless tobacco: Never  Vaping Use   Vaping Use: Never used  Substance and Sexual Activity   Alcohol use: Yes    Alcohol/week: 2.0 standard drinks of alcohol    Types: 2 Standard drinks or equivalent per week    Comment: 2 drinks/week   Drug use: No   Sexual activity: Yes  Other Topics Concern   Not on file  Social History Narrative   Pt is right handed   Lives alone in 2 story home    Has 2 adult children   Has 15 year of education   Retired early from counseling   Social Determinants of Radio broadcast assistant Strain: Not on file  Food Insecurity: Not on file  Transportation Needs: Not on file  Physical Activity: Not on file  Stress: Not on file  Social Connections: Not on file  Intimate Partner Violence: Not on file    PHYSICAL EXAM  GENERAL EXAM/CONSTITUTIONAL: Vitals:  Vitals:   01/03/23 0925  BP: (!) 153/90  Pulse: 69  Weight: 172 lb (78 kg)  Height: 5\' 8"  (1.727 m)   Body mass index is 26.15 kg/m. Wt Readings from Last 3 Encounters:  01/03/23 172 lb (78 kg)  11/08/19 206 lb (93.4 kg)  01/30/19 213 lb (96.6 kg)   Patient is in no distress; well developed, nourished and groomed; neck is supple   EYES: Visual fields full to confrontation, Extraocular movements intacts,   MUSCULOSKELETAL: Gait, strength, tone, movements noted in Neurologic exam below  NEUROLOGIC: MENTAL STATUS:      No data to display            01/03/2023    9:29 AM 10/30/2018    1:00 PM  Montreal Cognitive Assessment   Visuospatial/ Executive (0/5) 2 4  Naming (0/3) 2 3  Attention: Read list of digits (0/2) 2 2  Attention: Read list of letters (0/1) 1 1  Attention: Serial 7 subtraction starting at 100 (0/3) 0 2  Language: Repeat phrase (0/2) 0 0  Language : Fluency (0/1) 1 1  Abstraction (0/2) 2 0  Delayed Recall (0/5) 0 0  Orientation (0/6) 2 6  Total 12 19     CRANIAL NERVE:  2nd, 3rd, 4th, 6th- visual fields full to confrontation, extraocular muscles intact, no nystagmus 5th - facial sensation symmetric 7th - facial strength symmetric 8th - hearing intact 9th - palate elevates symmetrically, uvula midline 11th - shoulder shrug symmetric 12th - tongue protrusion midline  MOTOR:  normal bulk and tone, full strength in the BUE, BLE  SENSORY:  normal and symmetric to light touch  COORDINATION:  finger-nose-finger, fine finger movements  normal  GAIT/STATION:  normal   DIAGNOSTIC DATA (LABS, IMAGING, TESTING) - I reviewed patient records, labs, notes, testing and imaging myself where available.  Lab Results  Component Value Date   WBC 11.3 (H) 06/03/2020   HGB 12.6 06/03/2020   HCT 38.3 06/03/2020   MCV 98.2 06/03/2020   PLT 376 06/03/2020      Component Value Date/Time   NA 141 06/03/2020 1327   NA 142 02/23/2018 0000   K 3.8 06/03/2020 1327   CL 101 06/03/2020 1327   CO2 29 06/03/2020 1327   GLUCOSE 94 06/03/2020 1327   BUN 16 06/03/2020 1327   CREATININE 0.98 06/03/2020 1327   CALCIUM 9.3 06/03/2020 1327   PROT 7.4 11/08/2019 1829   ALBUMIN 4.2 11/08/2019 1829   AST 18 11/08/2019 1829   ALT 18 11/08/2019 1829   ALKPHOS 80 11/08/2019 1829   BILITOT 0.7 11/08/2019 1829   GFRNONAA >60 06/03/2020 1327   GFRAA >60 06/03/2020 1327   Lab Results  Component Value Date   CHOL 168 05/14/2018   HDL 61.50 05/14/2018   LDLCALC 84 05/14/2018   TRIG 113.0 05/14/2018   CHOLHDL 3 05/14/2018   Lab Results  Component Value Date   HGBA1C 6.1 05/14/2018   Lab Results  Component Value Date   [id number] 07/23/2018   Lab Results  Component Value Date   TSH 0.83 05/14/2018     ASSESSMENT AND PLAN  67 y.o. year old female with history of hypertension, hyperlipidemia, diabetes, rheumatoid arthritis who is presenting with memory complaint for the past 6 years.  Memory complaint described as difficulty with short-term memory, being repetitive and asking the same questions.  On exam today, she scored 12 on the MoCA indicative of impairment.  Patient is already  on Aricept 5 mg nightly, will increase to 10 mg and also obtain dementia labs including ATN profile.  I will also obtain a brain MRI.  I will contact the patient to go over the results.  I will see her in 1 year for follow-up or sooner if worse.  Both patient and boyfriend Juanda Crumble voiced understanding and are comfortable with plans.    1. Mild dementia  without behavioral disturbance, psychotic disturbance, mood disturbance, or anxiety, unspecified dementia type ([place])   2. Prediabetes   3. Alzheimer's disease, unspecified (CODE) ([place])     Patient Instructions  Dementia labs including B12, TSH and ATN profile MRI brain without contrast Referral for formal neuropsychological testing Increase Aricept to 10 mg nightly Continue your other medications Follow-up in 1 year or sooner if worse.  Orders Placed This Encounter  Procedures   MR BRAIN WO CONTRAST   Vitamin B12   TSH   ATN PROFILE   Ambulatory referral to Neuropsychology    Meds ordered this encounter  Medications   donepezil (ARICEPT) 10 MG tablet    Sig: Take 1 tablet (10 mg total) by mouth at bedtime. Patient needs to call our office to schedule a appointment for further refills [phone]    Dispense:  30 tablet    Refill:  11    *Patient needs a in office visit to be evaluated before she can get a year supply*    Return in about 1 year (around 01/03/2024).  I have spent a total of 60 minutes dedicated to this patient today, preparing to see patient, performing a medically appropriate examination and evaluation, ordering tests and/or medications and procedures, and counseling and educating the patient/family/caregiver; independently interpreting result and communicating results to the family/patient/caregiver; and documenting clinical information in the electronic medical record.   Alric Ran, MD 01/03/2023, 10:26 AM  [facility] [address], [place], [place] [address] [phone]

## 2023-01-03 NOTE — Patient Instructions (Addendum)
Dementia labs including B12, TSH and ATN profile MRI brain without contrast Referral for formal neuropsychological testing Increase Aricept to 10 mg nightly Continue your other medications Follow-up in 1 year or sooner if worse.

## 2023-01-06 LAB — ATN PROFILE
A -- Beta-amyloid 42/40 Ratio: 0.099 — ABNORMAL LOW (ref >0.102)
Beta-amyloid 40: 196.94 pg/mL
Beta-amyloid 42: 19.59 pg/mL
N -- NfL, Plasma: 2.36 pg/mL (ref 0.00–4.61)
T -- p-tau181: 1.89 pg/mL — ABNORMAL HIGH (ref 0.00–0.97)

## 2023-01-06 LAB — TSH: TSH: 1.11 u[IU]/mL (ref 0.450–4.500)

## 2023-01-06 LAB — VITAMIN B12: Vitamin B-12: 474 pg/mL (ref 232–1245)

## 2023-01-06 NOTE — Progress Notes (Signed)
Spoke with boyfriend Juanda Crumble, informed him that [place] labs are consistent with Alzheimer Dementia. For now, continue Aricept and follow up as indicated.

## 2023-01-11 ENCOUNTER — Telehealth: Payer: Self-pay | Admitting: Neurology

## 2023-01-11 NOTE — Telephone Encounter (Signed)
MRI order sent to [place] [phone]

## 2023-11-21 ENCOUNTER — Other Ambulatory Visit: Payer: Self-pay | Admitting: Neurology

## 2024-01-03 ENCOUNTER — Encounter: Payer: Self-pay | Admitting: Neurology

## 2024-01-03 ENCOUNTER — Ambulatory Visit (INDEPENDENT_AMBULATORY_CARE_PROVIDER_SITE_OTHER): Payer: Medicare Other | Admitting: Neurology

## 2024-01-03 VITALS — BP 122/97 | HR 64 | Resp 15 | Wt 189.5 lb

## 2024-01-03 DIAGNOSIS — F02A0 Dementia in other diseases classified elsewhere, mild, without behavioral disturbance, psychotic disturbance, mood disturbance, and anxiety: Secondary | ICD-10-CM | POA: Diagnosis not present

## 2024-01-03 DIAGNOSIS — G301 Alzheimer's disease with late onset: Secondary | ICD-10-CM | POA: Diagnosis not present

## 2024-01-03 MED ORDER — DONEPEZIL HCL 10 MG PO TABS: 10.0000 mg | ORAL_TABLET | Freq: Every day | ORAL | 3 refills | Status: DC

## 2024-01-03 MED ORDER — MEMANTINE HCL 10 MG PO TABS: 10.0000 mg | ORAL_TABLET | Freq: Two times a day (BID) | ORAL | 3 refills | Status: DC

## 2024-01-03 NOTE — Patient Instructions (Signed)
 Continue Aricept 10 mg nightly Start Namenda 10 mg nightly for 2 weeks then increase to twice daily Continue your other medications Follow-up in 1 year or sooner if worse.  There are well-accepted and sensible ways to reduce risk for Alzheimers disease and other degenerative brain disorders .  Exercise Daily Walk A daily 20 minute walk should be part of your routine. Disease related apathy can be a significant roadblock to exercise and the only way to overcome this is to make it a daily routine and perhaps have a reward at the end (something your loved one loves to eat or drink perhaps) or a personal trainer coming to the home can also be very useful. Most importantly, the patient is much more likely to exercise if the caregiver / spouse does it with him/her. In general a structured, repetitive schedule is best.  General Health: Any diseases which effect your body will effect your brain such as a pneumonia, urinary infection, blood clot, heart attack or stroke. Keep contact with your primary care doctor for regular follow ups.  Sleep. A good nights sleep is healthy for the brain. Seven hours is recommended. If you have insomnia or poor sleep habits we can give you some instructions. If you have sleep apnea wear your mask.  Diet: Eating a heart healthy diet is also a good idea; fish and poultry instead of red meat, nuts (mostly non-peanuts), vegetables, fruits, olive oil or canola oil (instead of butter), minimal salt (use other spices to flavor foods), whole grain rice, bread, cereal and pasta and wine in moderation.Research is now showing that the MIND diet, which is a combination of The Mediterranean diet and the DASH diet, is beneficial for cognitive processing and longevity. Information about this diet can be found in The MIND Diet, a book by Alonna Minium, MS, RDN, and online at [employer]  Finances, Power of [address] and Advance Directives: You should consider  putting legal safeguards in place with regard to financial and medical decision making. While the spouse always has power of attorney for medical and financial issues in the absence of any form, you should consider what you want in case the spouse / caregiver is no longer around or capable of making decisions.

## 2024-01-03 NOTE — Progress Notes (Signed)
 [facility]  PATIENT: Brandi Fitzgerald DOB: 02/21/1956  REQUESTING CLINICIAN: Achilles Dunk, DO HISTORY FROM: Patient and boyfriend Brandi Fitzgerald  REASON FOR VISIT: Memory deficit    HISTORICAL  CHIEF COMPLAINT:  Chief Complaint  Patient presents with   Memory Loss    Rm13, husband present, Dementia: MMSE score was 13    INTERVAL HISTORY 01/03/2024:  Patient presents today for follow-up, she is accompanied by her significant other Brandi Fitzgerald.  She tells me everything is fine since last visit.  Brandi Fitzgerald tells me that her memory continues to get worse, he does call her at least three times daily but sometimes, she does not recall that they had a conversation earlier.  He still helps with the medication, she now sometimes have difficulty using the microwave for some time the TV remote.  She is still able to maintain basic activity of daily living but currently not driving, and Brandi Fitzgerald is also helping paying some of the bills.  She is compliant with her medications, denies any side effect, she tells me that her sleep is good. Brandi Fitzgerald denies any increased agitation or irritability.   HISTORY OF PRESENT ILLNESS:  This is a 68 year old woman past medical history hypertension, hyperlipidemia, rheumatoid arthritis, memory problem who is presenting for memory loss.  She is accompanied by her boyfriend who provides Fitzgerald of the history.  Per boyfriend, patient memory loss started 6 years ago after the death of her husband.  Memory problem described as being forgetful, repeating herself, and asking the same questions.  Sometimes, she forgets to take her own medications. Patient is independent, she does live alone and independent all ADLs.  She still drives, denies any recent accident or being lost in familiar places.  She does pay her own bills, she has them on autopay. She has family history of dementia and in her mother and maternal grandmother     TBI:   No past history of TBI Stroke:    no past history of stroke Seizures:   no past history of seizures Sleep:   no history of sleep apnea.   Mood:   patient denies anxiety and depression Family history of Dementia: Mother with Dementia (late 69s)  Denies  Functional status: independent in all ADLs Patient lives with Brandi Fitzgerald, his boyfriend. Cooking: yes, microwave Cleaning: Yes  Shopping: Boyfriend  Bathing: no issues  Toileting: No issues  Driving: last time was 6 months ago  Bills: Patient, autopay   Ever left the stove on by accident?: Denies  Forget how to use items around the house?: Difficulty with microwave   Getting lost going to familiar places?: Denies  Forgetting loved ones names?: Denies  Word finding difficulty? Denies  Sleep: Good    OTHER MEDICAL CONDITIONS: Hypertension, hyperlipidemia, diabetes, rheumatoid arthritis.   REVIEW OF SYSTEMS: Full 14 system review of systems performed and negative with exception of: As noted in the HPI.   ALLERGIES: Allergies  Allergen Reactions   Januvia [Sitagliptin Phosphate] Anaphylaxis   Aspirin     Hives   Cephalosporins Swelling    Rash, itching   Penicillins Itching and Swelling    HOME MEDICATIONS: Outpatient Medications Prior to Visit  Medication Sig Dispense Refill   Abatacept 125 MG/ML SOSY Inject 125 mg into the skin every 14 (fourteen) days.     lisinopril (ZESTRIL) 20 MG tablet TAKE 1 TABLET BY MOUTH IN  THE MORNING 30 tablet 5   metFORMIN (GLUCOPHAGE) 500 MG tablet TAKE 1 TABLET BY MOUTH  DAILY WITH BREAKFAST 30 tablet 11   montelukast (SINGULAIR) 10 MG tablet Take 1 tablet (10 mg total) by mouth at bedtime. Please schedule a follow up appt with Dr. Artis Flock for further refills [phone] 30 tablet 0   ORENCIA 125 MG/ML SOSY Inject 125 mg into the muscle once a week.     predniSONE (DELTASONE) 5 MG tablet Take 5 mg by mouth daily with breakfast.     rosuvastatin (CRESTOR) 20 MG tablet TAKE 1 TABLET BY MOUTH  DAILY 30 tablet 0   valACYclovir  (VALTREX) 500 MG tablet One tablet BID x 3 days for outbreak start within 24 hour of symptom onset 18 tablet 3   VITAMIN D, CHOLECALCIFEROL, PO Take by mouth. Vit d 1.25 mg weekly     donepezil (ARICEPT) 10 MG tablet TAKE 1 TABLET (10 MG TOTAL) BY MOUTH AT BEDTIME. PATIENT NEEDS TO CALL OUR OFFICE TO SCHEDULE A APPOINTMENT FOR FURTHER REFILLS [phone] 90 tablet 1   ipratropium (ATROVENT) 0.03 % nasal spray Place 2 sprays into both nostrils daily. (Patient not taking: Reported on 01/03/2024)     No facility-administered medications prior to visit.    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   Procedure: LEFT TOTAL KNEE ARTHROPLASTY;  Surgeon: Shelda Pal, MD;  Location: [facility];  Service: Orthopedics;  Laterality: Left;   WRIST ARTHROSCOPY     right    FAMILY HISTORY: Family History  Problem Relation Age of Onset   Heart failure Mother    Hypertension Mother    Hyperlipidemia Mother    Arthritis Mother    Alzheimer's disease Mother        late 33's   Heart disease Father    Alcohol abuse Father    Arthritis Father    Arthritis Sister    Diabetes Sister    Hyperlipidemia Sister    Hypertension Sister    Memory loss Sister    Memory loss Brother    Drug abuse Brother    Heart disease Brother    Arthritis Sister    Memory loss Sister    Alzheimer's disease Maternal Uncle     SOCIAL HISTORY: Social History   Socioeconomic History   Marital status: Married    Spouse name: Not on file   Number of children: Not on file   Years of education: Not on file   Highest  education level: Not on file  Occupational History   Not on file  Tobacco Use   Smoking status: Former    Current packs/day: 0.00    Types: Cigarettes    Quit date: 01/28/1999    Years since quitting: 24.9   Smokeless tobacco: Never  Vaping Use   Vaping status: Never Used  Substance and Sexual Activity   Alcohol use: Yes    Alcohol/week: 2.0 standard drinks of alcohol    Types: 2 Standard drinks or equivalent per week    Comment: 2 drinks/week  Drug use: No   Sexual activity: Yes  Other Topics Concern   Not on file  Social History Narrative   Pt is right handed   Lives alone in 2 story home   Has 2 adult children   Has 15 year of education   Retired early from counseling   Social Drivers of Corporate investment banker Strain: Not on file  Food Insecurity: Not on file  Transportation Needs: Not on file  Physical Activity: Not on file  Stress: Not on file  Social Connections: Not on file  Intimate Partner Violence: Not on file    PHYSICAL EXAM  GENERAL EXAM/CONSTITUTIONAL: Vitals:  Vitals:   01/03/24 1045  BP: (!) 122/97  Pulse: 64  Resp: 15  Weight: 189 lb 8 oz (86 kg)   Body mass index is 28.81 kg/m. Wt Readings from Last 3 Encounters:  01/03/24 189 lb 8 oz (86 kg)  01/03/23 172 lb (78 kg)  11/08/19 206 lb (93.4 kg)   Patient is in no distress; well developed, nourished and groomed; neck is supple  MUSCULOSKELETAL: Gait, strength, tone, movements noted in Neurologic exam below  NEUROLOGIC: MENTAL STATUS:     01/03/2024   10:48 AM  MMSE - Mini Mental State Exam  Orientation to time 0  Orientation to Place 2  Registration 3  Attention/ Calculation 0  Recall 0  Language- name 2 objects 2  Language- repeat 1  Language- follow 3 step command 3  Language- read & follow direction 1  Write a sentence 1  Copy design 0  Total score 13      01/03/2023    9:29 AM 10/30/2018    1:00 PM  Montreal Cognitive Assessment   Visuospatial/ Executive (0/5)  2 4  Naming (0/3) 2 3  Attention: Read list of digits (0/2) 2 2  Attention: Read list of letters (0/1) 1 1  Attention: Serial 7 subtraction starting at 100 (0/3) 0 2  Language: Repeat phrase (0/2) 0 0  Language : Fluency (0/1) 1 1  Abstraction (0/2) 2 0  Delayed Recall (0/5) 0 0  Orientation (0/6) 2 6  Total 12 19     CRANIAL NERVE:  2nd, 3rd, 4th, 6th- visual fields full to confrontation, extraocular muscles intact, no nystagmus 5th - facial sensation symmetric 7th - facial strength symmetric 8th - hearing intact 9th - palate elevates symmetrically, uvula midline 11th - shoulder shrug symmetric 12th - tongue protrusion midline  MOTOR:  normal bulk and tone, full strength in the BUE, BLE  SENSORY:  normal and symmetric to light touch  COORDINATION:  finger-nose-finger, fine finger movements normal  GAIT/STATION:  normal   DIAGNOSTIC DATA (LABS, IMAGING, TESTING) - I reviewed patient records, labs, notes, testing and imaging myself where available.  Lab Results  Component Value Date   WBC 11.3 (H) 06/03/2020   HGB 12.6 06/03/2020   HCT 38.3 06/03/2020   MCV 98.2 06/03/2020   PLT 376 06/03/2020      Component Value Date/Time   NA 141 06/03/2020 1327   NA 142 02/23/2018 0000   K 3.8 06/03/2020 1327   CL 101 06/03/2020 1327   CO2 29 06/03/2020 1327   GLUCOSE 94 06/03/2020 1327   BUN 16 06/03/2020 1327   CREATININE 0.98 06/03/2020 1327   CALCIUM 9.3 06/03/2020 1327   PROT 7.4 11/08/2019 1829   ALBUMIN 4.2 11/08/2019 1829   AST 18 11/08/2019 1829   ALT 18 11/08/2019 1829   ALKPHOS 80  11/08/2019 1829   BILITOT 0.7 11/08/2019 1829   GFRNONAA >60 06/03/2020 1327   GFRAA >60 06/03/2020 1327   Lab Results  Component Value Date   CHOL 168 05/14/2018   HDL 61.50 05/14/2018   LDLCALC 84 05/14/2018   TRIG 113.0 05/14/2018   CHOLHDL 3 05/14/2018   Lab Results  Component Value Date   HGBA1C 6.1 05/14/2018   Lab Results  Component Value Date   VITAMINB12  474 01/03/2023   Lab Results  Component Value Date   TSH 1.110 01/03/2023   ATN Profile consistent with presence of Alzheimer disease pathology.   ASSESSMENT AND PLAN  68 y.o. year old female with history of hypertension, hyperlipidemia, diabetes, rheumatoid arthritis who is presenting for follow up for dementia.  Her lab results show positive Alzheimer disease biomarker, today on exam MMSE 13 out of 30.  Patient is compliant with the Aricept, will add Namenda. Plan will be for patient to continue current medications and we will see her in a year or sooner if worse.     1. Mild late onset Alzheimer's dementia without behavioral disturbance, psychotic disturbance, mood disturbance, or anxiety (HCC)      Patient Instructions  Continue Aricept 10 mg nightly Start Namenda 10 mg nightly for 2 weeks then increase to twice daily Continue your other medications Follow-up in 1 year or sooner if worse.  There are well-accepted and sensible ways to reduce risk for Alzheimers disease and other degenerative brain disorders .  Exercise Daily Walk A daily 20 minute walk should be part of your routine. Disease related apathy can be a significant roadblock to exercise and the only way to overcome this is to make it a daily routine and perhaps have a reward at the end (something your loved one loves to eat or drink perhaps) or a personal trainer coming to the home can also be very useful. Fitzgerald importantly, the patient is much more likely to exercise if the caregiver / spouse does it with him/her. In general a structured, repetitive schedule is best.  General Health: Any diseases which effect your body will effect your brain such as a pneumonia, urinary infection, blood clot, heart attack or stroke. Keep contact with your primary care doctor for regular follow ups.  Sleep. A good nights sleep is healthy for the brain. Seven hours is recommended. If you have insomnia or poor sleep habits we can give you  some instructions. If you have sleep apnea wear your mask.  Diet: Eating a heart healthy diet is also a good idea; fish and poultry instead of red meat, nuts (mostly non-peanuts), vegetables, fruits, olive oil or canola oil (instead of butter), minimal salt (use other spices to flavor foods), whole grain rice, bread, cereal and pasta and wine in moderation.Research is now showing that the MIND diet, which is a combination of The Mediterranean diet and the DASH diet, is beneficial for cognitive processing and longevity. Information about this diet can be found in The MIND Diet, a book by Alonna Minium, MS, RDN, and online at [employer].es  Finances, Power of [address] and Advance Directives: You should consider putting legal safeguards in place with regard to financial and medical decision making. While the spouse always has power of attorney for medical and financial issues in the absence of any form, you should consider what you want in case the spouse / caregiver is no longer around or capable of making decisions.   Orders Placed This Encounter  Procedures  MR BRAIN WO CONTRAST    Meds ordered this encounter  Medications   donepezil (ARICEPT) 10 MG tablet    Sig: Take 1 tablet (10 mg total) by mouth at bedtime. Patient needs to call our office to schedule a appointment for further refills [phone]    Dispense:  90 tablet    Refill:  3   memantine (NAMENDA) 10 MG tablet    Sig: Take 1 tablet (10 mg total) by mouth 2 (two) times daily.    Dispense:  180 tablet    Refill:  3    Return in about 1 year (around 01/02/2025).   Windell Norfolk, MD 01/03/2024, 12:31 PM  [facility] [address], [address] [place], [place] [address] [phone]

## 2024-01-04 NOTE — Progress Notes (Signed)
 Brandi Fitzgerald

## 2024-01-10 ENCOUNTER — Telehealth: Payer: Self-pay | Admitting: Neurology

## 2024-01-10 NOTE — Telephone Encounter (Signed)
 MRI order sent to [facility]. [phone]

## 2024-01-25 ENCOUNTER — Ambulatory Visit (HOSPITAL_COMMUNITY): Admission: RE | Admit: 2024-01-25 | Source: Ambulatory Visit

## 2024-05-06 ENCOUNTER — Other Ambulatory Visit: Payer: Self-pay | Admitting: Neurology

## 2024-05-06 MED ORDER — MEMANTINE HCL 10 MG PO TABS: 10.0000 mg | ORAL_TABLET | Freq: Two times a day (BID) | ORAL | 3 refills | Status: AC

## 2024-05-06 MED ORDER — DONEPEZIL HCL 10 MG PO TABS: 10.0000 mg | ORAL_TABLET | Freq: Every day | ORAL | 3 refills | Status: AC

## 2024-05-06 NOTE — Telephone Encounter (Signed)
 According to pt, Yes.

## 2024-05-06 NOTE — Telephone Encounter (Signed)
 This is [place] . Phone room is this correct? Store ID: #[id number]  [address], [place], [place] [address] Get directions [phone]

## 2024-05-06 NOTE — Telephone Encounter (Signed)
 Pt's son reports that pt is in the process of being moved , she is out of some and running very low on some medications, he is asking that the memantine  (NAMENDA ) 10 MG tablet  donepezil  (ARICEPT ) 10 MG tablet be called into [facility] Store ID: [phone]

## 2024-05-06 NOTE — Telephone Encounter (Signed)
 Refill sent.

## 2024-05-13 ENCOUNTER — Telehealth: Payer: Self-pay | Admitting: Neurology

## 2024-05-13 NOTE — Telephone Encounter (Signed)
Call to patient, no answer.

## 2024-05-13 NOTE — Telephone Encounter (Signed)
 Pt son called to to follow up about medication  [facility] received Medication , on 7-29  Pt son Serita) is requesting to speak to Md or nurse about seeing how he can get acces to  mother information , since him and his brother will be taking on responsibility to take care of Pt . Pt son  need to speak to someone to see how he can get the correct care for Pt .

## 2024-05-15 NOTE — Telephone Encounter (Signed)
 Patient's son, Jermal Smith notified [facility]  patient no longer in the state,patient is in [place] . Want to speak with Dr. Gregg about what is going on with my mother. Informed Mr. Claudene can not discuss health information due to not listed on DPR.

## 2025-01-02 ENCOUNTER — Ambulatory Visit: Admitting: Neurology
